# Patient Record
Sex: Male | Born: 1965 | Race: Black or African American | Hispanic: No | Marital: Single | Smoking: Current every day smoker
Health system: Southern US, Community
[De-identification: ages and names within clinical notes are randomized; demographics above are authoritative.]

## PROBLEM LIST (undated history)

## (undated) DIAGNOSIS — K5792 Diverticulitis of intestine, part unspecified, without perforation or abscess without bleeding: Secondary | ICD-10-CM

## (undated) HISTORY — PX: NO PAST SURGERIES: SHX2092

---

## 1999-04-01 ENCOUNTER — Encounter: Payer: Self-pay | Admitting: Emergency Medicine

## 1999-04-01 ENCOUNTER — Emergency Department (HOSPITAL_COMMUNITY): Admission: EM | Admit: 1999-04-01 | Discharge: 1999-04-01 | Payer: Self-pay | Admitting: Emergency Medicine

## 2012-07-30 ENCOUNTER — Emergency Department (HOSPITAL_COMMUNITY)
Admission: EM | Admit: 2012-07-30 | Discharge: 2012-07-30 | Disposition: A | Discharge status: Home / Self Care | Payer: Self-pay | Attending: Emergency Medicine | Admitting: Emergency Medicine

## 2012-07-30 ENCOUNTER — Emergency Department (HOSPITAL_COMMUNITY): Payer: Self-pay

## 2012-07-30 ENCOUNTER — Encounter (HOSPITAL_COMMUNITY): Payer: Self-pay | Admitting: *Deleted

## 2012-07-30 DIAGNOSIS — R1110 Vomiting, unspecified: Secondary | ICD-10-CM | POA: Insufficient documentation

## 2012-07-30 DIAGNOSIS — F17200 Nicotine dependence, unspecified, uncomplicated: Secondary | ICD-10-CM | POA: Insufficient documentation

## 2012-07-30 DIAGNOSIS — K5732 Diverticulitis of large intestine without perforation or abscess without bleeding: Secondary | ICD-10-CM | POA: Insufficient documentation

## 2012-07-30 DIAGNOSIS — R1032 Left lower quadrant pain: Secondary | ICD-10-CM | POA: Insufficient documentation

## 2012-07-30 DIAGNOSIS — K5792 Diverticulitis of intestine, part unspecified, without perforation or abscess without bleeding: Secondary | ICD-10-CM

## 2012-07-30 LAB — CBC WITH DIFFERENTIAL/PLATELET
Basophils Absolute: 0 10*3/uL (ref 0.0–0.1)
Basophils Relative: 0 % (ref 0–1)
Eosinophils Absolute: 0 10*3/uL (ref 0.0–0.7)
Eosinophils Relative: 0 % (ref 0–5)
HCT: 45.2 % (ref 39.0–52.0)
Hemoglobin: 15.2 g/dL (ref 13.0–17.0)
Lymphocytes Relative: 11 % — ABNORMAL LOW (ref 12–46)
Lymphs Abs: 2.1 10*3/uL (ref 0.7–4.0)
MCH: 29 pg (ref 26.0–34.0)
MCHC: 33.6 g/dL (ref 30.0–36.0)
MCV: 86.3 fL (ref 78.0–100.0)
Monocytes Absolute: 1.5 10*3/uL — ABNORMAL HIGH (ref 0.1–1.0)
Monocytes Relative: 8 % (ref 3–12)
Neutro Abs: 15.2 10*3/uL — ABNORMAL HIGH (ref 1.7–7.7)
Neutrophils Relative %: 81 % — ABNORMAL HIGH (ref 43–77)
Platelets: ADEQUATE 10*3/uL (ref 150–400)
RBC: 5.24 MIL/uL (ref 4.22–5.81)
RDW: 13.1 % (ref 11.5–15.5)
WBC: 18.8 10*3/uL — ABNORMAL HIGH (ref 4.0–10.5)

## 2012-07-30 LAB — BASIC METABOLIC PANEL
BUN: 10 mg/dL (ref 6–23)
CO2: 27 mEq/L (ref 19–32)
Calcium: 10.2 mg/dL (ref 8.4–10.5)
Chloride: 97 mEq/L (ref 96–112)
Creatinine, Ser: 0.88 mg/dL (ref 0.50–1.35)
GFR calc Af Amer: >90 mL/min (ref >90)
GFR calc non Af Amer: >90 mL/min (ref >90)
Glucose, Bld: 102 mg/dL — ABNORMAL HIGH (ref 70–99)
Potassium: 3.8 mEq/L (ref 3.5–5.1)
Sodium: 136 mEq/L (ref 135–145)

## 2012-07-30 LAB — HEPATIC FUNCTION PANEL
ALT: 20 U/L (ref 0–53)
Alkaline Phosphatase: 92 U/L (ref 39–117)
Bilirubin, Direct: <0.1 mg/dL (ref 0.0–0.3)
Total Bilirubin: 0.5 mg/dL (ref 0.3–1.2)
Total Protein: 8.4 g/dL — ABNORMAL HIGH (ref 6.0–8.3)

## 2012-07-30 LAB — OCCULT BLOOD, POC DEVICE: Fecal Occult Bld: NEGATIVE

## 2012-07-30 LAB — LIPASE, BLOOD: Lipase: 15 U/L (ref 11–59)

## 2012-07-30 MED ORDER — OXYCODONE-ACETAMINOPHEN 5-325 MG PO TABS: 2.0000 | ORAL_TABLET | ORAL | Status: DC | PRN

## 2012-07-30 MED ORDER — METRONIDAZOLE 500 MG PO TABS: 500.0000 mg | ORAL_TABLET | Freq: Two times a day (BID) | ORAL | Status: DC

## 2012-07-30 MED ORDER — IOHEXOL 300 MG/ML  SOLN
20.0000 mL | INTRAMUSCULAR | Status: AC
  Administered 2012-07-30 (×2): 20 mL via ORAL

## 2012-07-30 MED ORDER — CIPROFLOXACIN HCL 500 MG PO TABS: 500.0000 mg | ORAL_TABLET | Freq: Two times a day (BID) | ORAL | Status: DC

## 2012-07-30 MED ORDER — HYDROMORPHONE HCL PF 1 MG/ML IJ SOLN
1.0000 mg | Freq: Once | INTRAMUSCULAR | Status: AC
  Administered 2012-07-30: 1 mg via INTRAVENOUS
  Filled 2012-07-30: qty 1

## 2012-07-30 MED ORDER — METRONIDAZOLE IN NACL 5-0.79 MG/ML-% IV SOLN
500.0000 mg | Freq: Once | INTRAVENOUS | Status: AC
  Administered 2012-07-30: 500 mg via INTRAVENOUS
  Filled 2012-07-30: qty 100

## 2012-07-30 MED ORDER — CIPROFLOXACIN IN D5W 400 MG/200ML IV SOLN
400.0000 mg | Freq: Once | INTRAVENOUS | Status: AC
  Administered 2012-07-30: 400 mg via INTRAVENOUS
  Filled 2012-07-30: qty 200

## 2012-07-30 MED ORDER — MORPHINE SULFATE 4 MG/ML IJ SOLN
4.0000 mg | Freq: Once | INTRAMUSCULAR | Status: AC
  Administered 2012-07-30: 4 mg via INTRAVENOUS
  Filled 2012-07-30: qty 1

## 2012-07-30 MED ORDER — IOHEXOL 300 MG/ML  SOLN
80.0000 mL | Freq: Once | INTRAMUSCULAR | Status: AC | PRN
  Administered 2012-07-30: 80 mL via INTRAVENOUS

## 2012-07-30 MED ORDER — ONDANSETRON HCL 4 MG/2ML IJ SOLN
4.0000 mg | Freq: Once | INTRAMUSCULAR | Status: AC
  Administered 2012-07-30: 4 mg via INTRAVENOUS
  Filled 2012-07-30: qty 2

## 2012-07-30 NOTE — ED Notes (Signed)
Pt states he started having LLQ abdominal pain 3-4 day ago, states that it gradually got worse. Pt states "I felt aknot so I thought I had to go to the bathroom, so I started taking laxatives, but it still hurts" Pt LLQ of abdomen tender to palpation. No distension. Pt denies nausea, but had 1 episode of emesis in waiting room. Pt reports 9/10 pain upon moving and at rest. Pt states he put heat against affected area, and states that it seemed to make it worse.

## 2012-07-30 NOTE — ED Provider Notes (Signed)
8:44 PM Patient with a hx sig for abdominal pain was placed in CDU for observation and to receive IV antibiotics by Dr. Freida Busman. Patient care resumed from Dr. Freida Busman .  Patient is here for diverticulitis and has received IV Flagyl, Cipro, morphine and zofran. Patient re-evaluated and is resting comfortable, VSS, with no new complaints or concerns at this time. Plan per previous provider is to discharge with PO Cipro and Flagyl and percocet. On exam: hemodynamically stable, NAD, heart w/ RRR, lungs CTAB, Chest non-tender, no peripheral edema or calf tenderness. Abdominal tenderness to palpation most notably of lower left quadrant but patient expresses subjective improvement of pain since arrival.   Patient will be discharged and instructions to return with worsening or concerning symptoms.      Emilia Beck, PA-C 07/30/12 2049

## 2012-07-30 NOTE — ED Provider Notes (Signed)
History     CSN: [id number]  Arrival date & time 07/30/12  1001   First MD Initiated Contact with Patient 07/30/12 1124      Chief Complaint  Patient presents with  . Abdominal Pain    (Consider location/radiation/quality/duration/timing/severity/associated sxs/prior treatment) The history is provided by the patient and medical records.    Grant Mitchell is a 46 y.o. male presents for left lower quadrant abdominal pain. Patient states it began gradually approximately 4 days ago, has been persistent and gradually worsening. Patient states he thought that he was constipated and has been "popping laxatives." This caused him to have a number of bowel movements but that did not ease his pain. Patient states he was unable to sleep last night due to the pain in his abdomen.  Patient states the pain is localized to the left lower quadrant, described as sharp, rated an 8 at 10, nonradiating.  Denies fever, chills, headache, neck pain, chest pain, shortness of breath, heartburn, nausea, diarrhea, loss of consciousness, syncope, weakness.  Patient states he thought that he might have blood in his stool however he's been taking a great medication for his cold and he believes that the red in his stool is from the dye. He states to read in his stool was bright red, like "fake red."  No history of abdominal surgery, diverticulitis or diverticulosis.  Patient also states he had one episode of vomiting this morning. He states it was nonbloody, nonbilious.  History reviewed. No pertinent past medical history.  History reviewed. No pertinent past surgical history.  No family history on file.  History  Substance Use Topics  . Smoking status: Current Every Day Smoker  . Smokeless tobacco: Not on file  . Alcohol Use: No      Review of Systems  Constitutional: Negative for fever, diaphoresis, appetite change, fatigue and unexpected weight change.  HENT: Negative for mouth sores, trouble swallowing,  neck pain and neck stiffness.   Respiratory: Negative for cough, chest tightness, shortness of breath, wheezing and stridor.   Cardiovascular: Negative for chest pain and palpitations.  Gastrointestinal: Positive for vomiting (x1) and abdominal pain. Negative for nausea, diarrhea, constipation, blood in stool, abdominal distention and rectal pain.  Genitourinary: Negative for dysuria, urgency, frequency, hematuria, flank pain and difficulty urinating.  Musculoskeletal: Negative for back pain.  Skin: Negative for rash.  Neurological: Negative for weakness.  Hematological: Negative for adenopathy.  Psychiatric/Behavioral: Negative for confusion.  All other systems reviewed and are negative.    Allergies  Ibuprofen and Penicillins  Home Medications   Current Outpatient Rx  Name Route Sig Dispense Refill  . GUAIFENESIN 100 MG/5ML PO LIQD Oral Take 200 mg by mouth 3 (three) times daily as needed. Cold, congestion    . NYQUIL PO Oral Take 15 mLs by mouth every 6 (six) hours as needed. Cold symptoms      BP 127/82  Pulse 87  Temp 97.4 F (36.3 C) (Rectal)  Resp 20  SpO2 100%  Physical Exam  Nursing note and vitals reviewed. Constitutional: He appears well-developed and well-nourished. No distress.  HENT:  Head: Normocephalic and atraumatic.  Mouth/Throat: Oropharynx is clear and moist. No oropharyngeal exudate.  Eyes: Conjunctivae normal are normal. Pupils are equal, round, and reactive to light. No scleral icterus.  Neck: Normal range of motion. Neck supple.  Cardiovascular: Normal rate, regular rhythm and intact distal pulses.   Pulmonary/Chest: Effort normal and breath sounds normal. No respiratory distress. He has no wheezes.  Abdominal: Soft. Normal appearance and bowel sounds are normal. He exhibits no mass. There is no hepatosplenomegaly. There is tenderness in the left lower quadrant. There is guarding (with palpation of LLQ). There is no rigidity, no rebound, no CVA  tenderness, no tenderness at McBurney's point and negative Murphy's sign.       Positive peritoneal signs in the LLQ  Musculoskeletal: Normal range of motion. He exhibits no edema.  Neurological: He is alert. He exhibits normal muscle tone. Coordination normal.       Speech is clear and goal oriented Moves extremities without ataxia  Skin: Skin is warm and dry. No rash noted. He is not diaphoretic.  Psychiatric: He has a normal mood and affect.    ED Course  Procedures (including critical care time)  Labs Reviewed  CBC WITH DIFFERENTIAL - Abnormal; Notable for the following:    WBC 18.8 (*)  WHITE COUNT CONFIRMED ON SMEAR   Neutrophils Relative 81 (*)     Lymphocytes Relative 11 (*)     Neutro Abs 15.2 (*)     Monocytes Absolute 1.5 (*)     All other components within normal limits  BASIC METABOLIC PANEL - Abnormal; Notable for the following:    Glucose, Bld 102 (*)     All other components within normal limits  HEPATIC FUNCTION PANEL - Abnormal; Notable for the following:    Total Protein 8.4 (*)     All other components within normal limits  LIPASE, BLOOD  OCCULT BLOOD, POC DEVICE  URINALYSIS, ROUTINE W REFLEX MICROSCOPIC   Ct Abdomen Pelvis W Contrast  07/30/2012  *RADIOLOGY REPORT*  Clinical Data: Left lower quadrant abdominal pain  CT ABDOMEN AND PELVIS WITH CONTRAST  Technique:  Multidetector CT imaging of the abdomen and pelvis was performed following the standard protocol during bolus administration of intravenous contrast.  Contrast: 80mL OMNIPAQUE IOHEXOL 300 MG/ML  SOLN  Comparison: None.  Findings: Minimal dependent atelectasis at the lung bases.  Scattered probable hepatic cysts measuring up to 9 mm.  Spleen, pancreas, and adrenal glands are within normal limits.  Gallbladder is unremarkable.  No intrahepatic or extrahepatic ductal dilatation.  Kidneys are within normal limits.  No hydronephrosis.  No evidence of bowel obstruction.  Pericolonic inflammatory changes  involving the proximal sigmoid colon (series 2/image 49), suspicious for sigmoid diverticulitis. Associated pericolonic wall thickening (series 2/image 47).  No drainable fluid collection or abscess.  No free air.  No evidence of abdominal aortic aneurysm.  No abdominopelvic ascites.  No suspicious abdominopelvic lymphadenopathy.  Prostate is unremarkable.  Bladder is within normal limits.  Fluid within the right inguinal canal.  Visualized osseous structures are within normal limits.  IMPRESSION: Suspected sigmoid diverticulitis.  No drainable fluid collection or abscess.  No free air.  Screening colonoscopy is suggested following resolution of acute symptoms.   Original Report Authenticated By: Charline Bills, M.D.     Results for orders placed during the hospital encounter of 07/30/12  CBC WITH DIFFERENTIAL      Component Value Range   WBC 18.8 (*) 4.0 - 10.5 K/uL   RBC 5.24  4.22 - 5.81 MIL/uL   Hemoglobin 15.2  13.0 - 17.0 g/dL   HCT 16.1  09.6 - 04.5 %   MCV 86.3  78.0 - 100.0 fL   MCH 29.0  26.0 - 34.0 pg   MCHC 33.6  30.0 - 36.0 g/dL   RDW 40.9  81.1 - 91.4 %   Platelets  150 - 400 K/uL   Value: PLATELET CLUMPS NOTED ON SMEAR, COUNT APPEARS ADEQUATE   Neutrophils Relative 81 (*) 43 - 77 %   Lymphocytes Relative 11 (*) 12 - 46 %   Monocytes Relative 8  3 - 12 %   Eosinophils Relative 0  0 - 5 %   Basophils Relative 0  0 - 1 %   Neutro Abs 15.2 (*) 1.7 - 7.7 K/uL   Lymphs Abs 2.1  0.7 - 4.0 K/uL   Monocytes Absolute 1.5 (*) 0.1 - 1.0 K/uL   Eosinophils Absolute 0.0  0.0 - 0.7 K/uL   Basophils Absolute 0.0  0.0 - 0.1 K/uL   Smear Review MORPHOLOGY UNREMARKABLE    BASIC METABOLIC PANEL      Component Value Range   Sodium 136  135 - 145 mEq/L   Potassium 3.8  3.5 - 5.1 mEq/L   Chloride 97  96 - 112 mEq/L   CO2 27  19 - 32 mEq/L   Glucose, Bld 102 (*) 70 - 99 mg/dL   BUN 10  6 - 23 mg/dL   Creatinine, Ser 1.61  0.50 - 1.35 mg/dL   Calcium 09.6  8.4 - 04.5 mg/dL   GFR calc  non Af Amer >90  >90 mL/min   GFR calc Af Amer >90  >90 mL/min  LIPASE, BLOOD      Component Value Range   Lipase 15  11 - 59 U/L  HEPATIC FUNCTION PANEL      Component Value Range   Total Protein 8.4 (*) 6.0 - 8.3 g/dL   Albumin 4.3  3.5 - 5.2 g/dL   AST 15  0 - 37 U/L   ALT 20  0 - 53 U/L   Alkaline Phosphatase 92  39 - 117 U/L   Total Bilirubin 0.5  0.3 - 1.2 mg/dL   Bilirubin, Direct <4.0  0.0 - 0.3 mg/dL   Indirect Bilirubin NOT CALCULATED  0.3 - 0.9 mg/dL  OCCULT BLOOD, POC DEVICE      Component Value Range   Fecal Occult Bld NEGATIVE     Ct Abdomen Pelvis W Contrast  07/30/2012  *RADIOLOGY REPORT*  Clinical Data: Left lower quadrant abdominal pain  CT ABDOMEN AND PELVIS WITH CONTRAST  Technique:  Multidetector CT imaging of the abdomen and pelvis was performed following the standard protocol during bolus administration of intravenous contrast.  Contrast: 80mL OMNIPAQUE IOHEXOL 300 MG/ML  SOLN  Comparison: None.  Findings: Minimal dependent atelectasis at the lung bases.  Scattered probable hepatic cysts measuring up to 9 mm.  Spleen, pancreas, and adrenal glands are within normal limits.  Gallbladder is unremarkable.  No intrahepatic or extrahepatic ductal dilatation.  Kidneys are within normal limits.  No hydronephrosis.  No evidence of bowel obstruction.  Pericolonic inflammatory changes involving the proximal sigmoid colon (series 2/image 49), suspicious for sigmoid diverticulitis. Associated pericolonic wall thickening (series 2/image 47).  No drainable fluid collection or abscess.  No free air.  No evidence of abdominal aortic aneurysm.  No abdominopelvic ascites.  No suspicious abdominopelvic lymphadenopathy.  Prostate is unremarkable.  Bladder is within normal limits.  Fluid within the right inguinal canal.  Visualized osseous structures are within normal limits.  IMPRESSION: Suspected sigmoid diverticulitis.  No drainable fluid collection or abscess.  No free air.  Screening  colonoscopy is suggested following resolution of acute symptoms.   Original Report Authenticated By: Charline Bills, M.D.       1. Diverticulitis  MDM  Lawana Chambers Siebers chest abdominal pain.  His pain and left lower quadrant with peritoneal signs.  Concern for diverticulitis versus perforated bowel.  CBC with elevated white count at 18.8 and left shift. Lipase within normal limits, BMP unremarkable, occult blood negative.  CT with diverticulitis.  Pt pain medication redosed, IV cipro and flagyl started.  After IV abx, if patient's pain is under control he may go home with PO abx.  I have discussed the patient with Mckinley Jewel. PA-C in the CDU and she will continue care.         Dahlia Client Charlese Gruetzmacher, PA-C 07/30/12 1612

## 2012-07-30 NOTE — ED Provider Notes (Signed)
Medical screening examination/treatment/procedure(s) were conducted as a shared visit with non-physician practitioner(s) and myself.  I personally evaluated the patient during the encounter  Pt to have iv abx and likely d/c to home--no surgical present  Toy Baker, MD 07/30/12 [phone]

## 2012-07-30 NOTE — ED Notes (Signed)
Pt is here with LLQ abdominal pain for 4 days and thought he need to poop and took laxative.  Pt is having bowel movement but no relief of pain.  Pt thought he may have seen some blood but not sure.  No urinary symptoms

## 2012-07-31 NOTE — ED Provider Notes (Signed)
Medical screening examination/treatment/procedure(s) were conducted as a shared visit with non-physician practitioner(s) and myself.  I personally evaluated the patient during the encounter  Toy Baker, MD 07/31/12 [phone]

## 2012-07-31 NOTE — ED Provider Notes (Signed)
Medical screening examination/treatment/procedure(s) were conducted as a shared visit with non-physician practitioner(s) and myself.  I personally evaluated the patient during the encounter  Caydn Justen T Jrake Rodriquez, MD 07/31/12 0804 

## 2017-12-12 ENCOUNTER — Emergency Department (HOSPITAL_COMMUNITY)
Admission: EM | Admit: 2017-12-12 | Discharge: 2017-12-12 | Disposition: A | Discharge status: Home / Self Care | Payer: Self-pay | Attending: Emergency Medicine | Admitting: Emergency Medicine

## 2017-12-12 ENCOUNTER — Emergency Department (HOSPITAL_COMMUNITY): Payer: Self-pay

## 2017-12-12 ENCOUNTER — Encounter (HOSPITAL_COMMUNITY): Payer: Self-pay

## 2017-12-12 DIAGNOSIS — M545 Low back pain, unspecified: Secondary | ICD-10-CM

## 2017-12-12 DIAGNOSIS — F17210 Nicotine dependence, cigarettes, uncomplicated: Secondary | ICD-10-CM | POA: Insufficient documentation

## 2017-12-12 MED ORDER — METHOCARBAMOL 750 MG PO TABS: 750.0000 mg | ORAL_TABLET | Freq: Three times a day (TID) | ORAL | 0 refills | Status: DC | PRN

## 2017-12-12 MED ORDER — KETOROLAC TROMETHAMINE 30 MG/ML IJ SOLN
30.0000 mg | Freq: Once | INTRAMUSCULAR | Status: AC
  Administered 2017-12-12: 30 mg via INTRAMUSCULAR
  Filled 2017-12-12: qty 1

## 2017-12-12 MED ORDER — DIAZEPAM 5 MG PO TABS
5.0000 mg | ORAL_TABLET | Freq: Once | ORAL | Status: AC
Start: 2017-12-12 — End: 2017-12-12
  Administered 2017-12-12: 5 mg via ORAL
  Filled 2017-12-12: qty 1

## 2017-12-12 NOTE — ED Notes (Signed)
ED Provider at bedside. 

## 2017-12-12 NOTE — ED Triage Notes (Signed)
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 Pt was ambulatory in ED. Denies any numbness or tingling in arms or legs. No change in bowel or bladder.

## 2017-12-12 NOTE — ED Provider Notes (Signed)
[facility] Provider [facility]   CSN: [id number] Arrival date & time: 12/12/17  0732     History   Chief Complaint Chief Complaint  Patient presents with  . Back Pain    HPI Reginold AgentDonald B Tatro is a 52 y.o. male reports that he was at work rolling a large heavy mold When he used a twisting motion with his back to push it the rest of the way.  He reports sudden, immediate onset of bilateral back pain.  He denies any changes to bowel or bladder function, no leg numbness or tingling.  He is able to walk without difficulty.  Tried anything for his pain, it is made worse with movement and sitting.   HPI  History reviewed. No pertinent past medical history.  There are no active problems to display for this patient.   History reviewed. No pertinent surgical history.     Home Medications    Prior to Admission medications   Medication Sig Start Date End Date Taking? Authorizing Provider  ciprofloxacin (CIPRO) 500 MG tablet Take 1 tablet (500 mg total) by mouth every 12 (twelve) hours. 07/30/12   Emilia BeckSzekalski, Kaitlyn, PA-C  guaiFENesin (ROBITUSSIN) 100 MG/5ML liquid Take 200 mg by mouth 3 (three) times daily as needed. Cold, congestion    [provider]  methocarbamol (ROBAXIN) 750 MG tablet Take 1-2 tablets (750-1,500 mg total) by mouth 3 (three) times daily as needed for muscle spasms. 12/12/17   Cristina GongHammond, Marizol Borror W, PA-C  metroNIDAZOLE (FLAGYL) 500 MG tablet Take 1 tablet (500 mg total) by mouth 2 (two) times daily. 07/30/12   Emilia BeckSzekalski, Kaitlyn, PA-C  oxyCODONE-acetaminophen (PERCOCET/ROXICET) 5-325 MG per tablet Take 2 tablets by mouth every 4 (four) hours as needed for pain. 07/30/12   Szekalski, Kaitlyn, PA-C  Pseudoeph-Doxylamine-DM-APAP (NYQUIL PO) Take 15 mLs by mouth every 6 (six) hours as needed. Cold symptoms    [provider]    Family History No family history on file.  Social History Social History   Tobacco Use  .  Smoking status: Current Every Day Smoker    Packs/day: 1.00    Types: Cigarettes  . Smokeless tobacco: Never Used  Substance Use Topics  . Alcohol use: No  . Drug use: Yes    Types: Marijuana     Allergies   Ibuprofen and Penicillins   Review of Systems Review of Systems  Constitutional: Negative for chills and fever.  Gastrointestinal: Negative for abdominal pain.  Genitourinary: Negative for decreased urine volume, difficulty urinating and dysuria.  Musculoskeletal: Positive for back pain. Negative for neck pain and neck stiffness.  Neurological: Negative for headaches.  Psychiatric/Behavioral: Negative for confusion.     Physical Exam Updated Vital Signs BP 115/65 (BP Location: Right Arm)   Pulse 71   Temp 98.1 F (36.7 C) (Oral)   Resp 17   Ht 5\' 7"  (1.702 m)   Wt 68 kg (150 lb)   SpO2 100%   BMI 23.49 kg/m   Physical Exam  Constitutional: He appears well-developed and well-nourished.  Cardiovascular: Intact distal pulses.  2+ DP/PT pulses bilaterally.  Bilateral feet are warm and well perfused.  Abdominal: Soft. He exhibits no distension. There is no tenderness. There is no guarding.  No pulsatile masses palpated.  Musculoskeletal:  There is tender to palpation over bilateral lumbar back.  Muscles here feel tight, and spasm.  There is no midline tenderness.  Palpation over bilateral lumbar paraspinal muscles both re-creates and exacerbates the reported  pain.  Neurological:  Intact sensation to bilateral lower extremities.  Skin: Skin is warm and dry. Capillary refill takes less than 2 seconds. He is not diaphoretic.  Nursing note and vitals reviewed.    ED Treatments / Results  Labs (all labs ordered are listed, but only abnormal results are displayed) Labs Reviewed - No data to display  EKG  EKG Interpretation None       Radiology Dg Lumbar Spine Complete  Result Date: 12/12/2017 CLINICAL DATA:  Low back pain following work injury. EXAM:  LUMBAR SPINE - COMPLETE 4+ VIEW COMPARISON:  Coronal and sagittal reconstructed images through the lumbar spine from an abdominal and pelvic CT scan dated July 30, 2012. FINDINGS: The lumbar vertebral bodies are preserved in height. The disc space heights are well maintained. There is no spondylolisthesis. The pedicles and transverse processes are intact. The observed portions of the sacrum are normal. IMPRESSION: There is no acute or chronic bony abnormality of the lumbar spine. Electronically Signed   By: David  [place] M.D.   On: 12/12/2017 10:19    Procedures Procedures (including critical care time)  Medications Ordered in ED Medications  diazepam (VALIUM) tablet 5 mg (5 mg Oral Given 12/12/17 1023)  ketorolac (TORADOL) 30 MG/ML injection 30 mg (30 mg Intramuscular Given 12/12/17 1023)     Initial Impression / Assessment and Plan / ED Course  I have reviewed the triage vital signs and the nursing notes.  Pertinent labs & imaging results that were available during my care of the patient were reviewed by me and considered in my medical decision making (see chart for details).    Patient with back pain.  No neurological deficits and normal neuro exam.  Patient can walk but states is painful.  No loss of bowel or bladder control.  No concern for cauda equina.  No fever, night sweats, weight loss, h/o cancer, IVDU.  RICE protocol and pain medicine indicated and discussed with patient. We discussed toradol as he has a listed allergy to iburprofen but it is stomach upset.  He wishes to have toradol shot.      Final Clinical Impressions(s) / ED Diagnoses   Final diagnoses:  Acute bilateral low back pain without sciatica    ED Discharge Orders        Ordered    methocarbamol (ROBAXIN) 750 MG tablet  3 times daily PRN     12/12/17 1026       Cristina Gong, [place] 12/12/17 1358    Cathren Laine, MD 12/12/17 1439

## 2017-12-12 NOTE — Discharge Instructions (Signed)
Do not take any ibuprofen or medications other than tylenol for 8 hours after your shot of Toradol.  Please take Ibuprofen (Advil, motrin) and Tylenol (acetaminophen) to relieve your pain.  You may take up to 600 MG (3 pills) of normal strength ibuprofen every 8 hours as needed.  In between doses of ibuprofen you make take tylenol, up to 1,000 mg (two extra strength pills).  Do not take more than 3,000 mg tylenol in a 24 hour period.  Please check all medication labels as many medications such as pain and cold medications may contain tylenol.  Do not drink alcohol while taking these medications.  Do not take other NSAID'S while taking ibuprofen (such as aleve or naproxen).  Please take ibuprofen with food to decrease stomach upset.  The best way to get rid of muscle pain is by taking NSAIDS, using heat, massage therapy, and gentle stretching/range of motion exercises.  Today you received medications that may make you sleepy or impair your ability to make decisions.  For the next 24 hours please do not drive, operate heavy machinery, care for a small child with out another adult present, or perform any activities that may cause harm to you or someone else if you were to fall asleep or be impaired.   You are being prescribed a medication which may make you sleepy. Please follow up of listed precautions for at least 24 hours after taking one dose.  Once you get insurance please establish care with a primary care doctor.

## 2019-03-30 ENCOUNTER — Encounter (HOSPITAL_COMMUNITY): Payer: Self-pay | Admitting: Emergency Medicine

## 2019-03-30 ENCOUNTER — Inpatient Hospital Stay (HOSPITAL_COMMUNITY)
Admission: EM | Admit: 2019-03-30 | Discharge: 2019-04-02 | DRG: 392 | Disposition: A | Discharge status: Home / Self Care | Payer: No Typology Code available for payment source | Attending: Internal Medicine | Admitting: Internal Medicine

## 2019-03-30 ENCOUNTER — Other Ambulatory Visit: Payer: Self-pay

## 2019-03-30 ENCOUNTER — Emergency Department (HOSPITAL_COMMUNITY): Payer: No Typology Code available for payment source

## 2019-03-30 DIAGNOSIS — R1032 Left lower quadrant pain: Secondary | ICD-10-CM | POA: Diagnosis present

## 2019-03-30 DIAGNOSIS — K5720 Diverticulitis of large intestine with perforation and abscess without bleeding: Principal | ICD-10-CM | POA: Diagnosis present

## 2019-03-30 DIAGNOSIS — Z886 Allergy status to analgesic agent status: Secondary | ICD-10-CM

## 2019-03-30 DIAGNOSIS — Z880 Allergy status to penicillin: Secondary | ICD-10-CM | POA: Diagnosis not present

## 2019-03-30 DIAGNOSIS — K5792 Diverticulitis of intestine, part unspecified, without perforation or abscess without bleeding: Secondary | ICD-10-CM

## 2019-03-30 DIAGNOSIS — Z8719 Personal history of other diseases of the digestive system: Secondary | ICD-10-CM | POA: Diagnosis not present

## 2019-03-30 DIAGNOSIS — R3129 Other microscopic hematuria: Secondary | ICD-10-CM | POA: Diagnosis present

## 2019-03-30 DIAGNOSIS — Z87891 Personal history of nicotine dependence: Secondary | ICD-10-CM | POA: Diagnosis not present

## 2019-03-30 DIAGNOSIS — Z20828 Contact with and (suspected) exposure to other viral communicable diseases: Secondary | ICD-10-CM | POA: Diagnosis present

## 2019-03-30 DIAGNOSIS — F17210 Nicotine dependence, cigarettes, uncomplicated: Secondary | ICD-10-CM | POA: Diagnosis present

## 2019-03-30 HISTORY — DX: Diverticulitis of intestine, part unspecified, without perforation or abscess without bleeding: K57.92

## 2019-03-30 LAB — URINALYSIS, ROUTINE W REFLEX MICROSCOPIC
Bacteria, UA: NONE SEEN
Bilirubin Urine: NEGATIVE
Glucose, UA: NEGATIVE mg/dL
Ketones, ur: 20 mg/dL — AB
Leukocytes,Ua: NEGATIVE
Nitrite: NEGATIVE
Protein, ur: NEGATIVE mg/dL
Specific Gravity, Urine: >1.046 — ABNORMAL HIGH (ref 1.005–1.030)
pH: 5 (ref 5.0–8.0)

## 2019-03-30 LAB — CBC WITH DIFFERENTIAL/PLATELET
Abs Immature Granulocytes: 0.1 10*3/uL — ABNORMAL HIGH (ref 0.00–0.07)
Basophils Absolute: 0 10*3/uL (ref 0.0–0.1)
Basophils Relative: 0 %
Eosinophils Absolute: 0 10*3/uL (ref 0.0–0.5)
Eosinophils Relative: 0 %
HCT: 46 % (ref 39.0–52.0)
Hemoglobin: 14.5 g/dL (ref 13.0–17.0)
Immature Granulocytes: 1 %
Lymphocytes Relative: 10 %
Lymphs Abs: 2 10*3/uL (ref 0.7–4.0)
MCH: 28.1 pg (ref 26.0–34.0)
MCHC: 31.5 g/dL (ref 30.0–36.0)
MCV: 89.1 fL (ref 80.0–100.0)
Monocytes Absolute: 1.9 10*3/uL — ABNORMAL HIGH (ref 0.1–1.0)
Monocytes Relative: 9 %
Neutro Abs: 17 10*3/uL — ABNORMAL HIGH (ref 1.7–7.7)
Neutrophils Relative %: 80 %
Platelets: 268 10*3/uL (ref 150–400)
RBC: 5.16 MIL/uL (ref 4.22–5.81)
RDW: 13.2 % (ref 11.5–15.5)
WBC: 21.1 10*3/uL — ABNORMAL HIGH (ref 4.0–10.5)
nRBC: 0 % (ref 0.0–0.2)

## 2019-03-30 LAB — COMPREHENSIVE METABOLIC PANEL
ALT: 18 U/L (ref 0–44)
AST: 11 U/L — ABNORMAL LOW (ref 15–41)
Albumin: 3.7 g/dL (ref 3.5–5.0)
Alkaline Phosphatase: 70 U/L (ref 38–126)
Anion gap: 11 (ref 5–15)
BUN: 8 mg/dL (ref 6–20)
CO2: 25 mmol/L (ref 22–32)
Calcium: 9.4 mg/dL (ref 8.9–10.3)
Chloride: 101 mmol/L (ref 98–111)
Creatinine, Ser: 0.85 mg/dL (ref 0.61–1.24)
GFR calc Af Amer: >60 mL/min (ref >60)
GFR calc non Af Amer: >60 mL/min (ref >60)
Glucose, Bld: 110 mg/dL — ABNORMAL HIGH (ref 70–99)
Potassium: 4 mmol/L (ref 3.5–5.1)
Sodium: 137 mmol/L (ref 135–145)
Total Bilirubin: 0.9 mg/dL (ref 0.3–1.2)
Total Protein: 7.4 g/dL (ref 6.5–8.1)

## 2019-03-30 LAB — LACTIC ACID, PLASMA: Lactic Acid, Venous: 1 mmol/L (ref 0.5–1.9)

## 2019-03-30 LAB — SARS CORONAVIRUS 2 BY RT PCR (HOSPITAL ORDER, PERFORMED IN ~~LOC~~ HOSPITAL LAB): SARS Coronavirus 2: NEGATIVE

## 2019-03-30 MED ORDER — HYDROMORPHONE HCL 1 MG/ML IJ SOLN
1.0000 mg | Freq: Once | INTRAMUSCULAR | Status: AC
  Administered 2019-03-30: 12:00:00 1 mg via INTRAVENOUS
  Filled 2019-03-30: qty 1

## 2019-03-30 MED ORDER — HYDROMORPHONE HCL 1 MG/ML IJ SOLN
1.0000 mg | INTRAMUSCULAR | Status: DC | PRN
  Administered 2019-03-30 – 2019-04-01 (×5): 1 mg via INTRAVENOUS
  Filled 2019-03-30 (×7): qty 1

## 2019-03-30 MED ORDER — METRONIDAZOLE IN NACL 5-0.79 MG/ML-% IV SOLN
500.0000 mg | Freq: Three times a day (TID) | INTRAVENOUS | Status: DC
  Administered 2019-03-30 – 2019-04-02 (×8): 500 mg via INTRAVENOUS
  Filled 2019-03-30 (×8): qty 100

## 2019-03-30 MED ORDER — SODIUM CHLORIDE 0.9 % IV BOLUS
1000.0000 mL | Freq: Once | INTRAVENOUS | Status: AC
  Administered 2019-03-30: 10:00:00 1000 mL via INTRAVENOUS

## 2019-03-30 MED ORDER — ENOXAPARIN SODIUM 40 MG/0.4ML ~~LOC~~ SOLN
40.0000 mg | SUBCUTANEOUS | Status: DC
  Administered 2019-03-30 – 2019-03-31 (×2): 40 mg via SUBCUTANEOUS
  Filled 2019-03-30 (×3): qty 0.4

## 2019-03-30 MED ORDER — ONDANSETRON HCL 4 MG/2ML IJ SOLN
4.0000 mg | Freq: Once | INTRAMUSCULAR | Status: AC
  Administered 2019-03-30: 4 mg via INTRAVENOUS
  Filled 2019-03-30: qty 2

## 2019-03-30 MED ORDER — IOHEXOL 300 MG/ML  SOLN
100.0000 mL | Freq: Once | INTRAMUSCULAR | Status: AC | PRN
  Administered 2019-03-30: 12:00:00 100 mL via INTRAVENOUS

## 2019-03-30 MED ORDER — MORPHINE SULFATE (PF) 4 MG/ML IV SOLN
4.0000 mg | Freq: Once | INTRAVENOUS | Status: AC
Start: 2019-03-30 — End: 2019-03-30
  Administered 2019-03-30: 10:00:00 4 mg via INTRAVENOUS
  Filled 2019-03-30: qty 1

## 2019-03-30 MED ORDER — SODIUM CHLORIDE 0.9 % IV SOLN
2.0000 g | Freq: Three times a day (TID) | INTRAVENOUS | Status: DC
  Administered 2019-03-30 – 2019-03-31 (×2): 2 g via INTRAVENOUS
  Filled 2019-03-30 (×5): qty 2

## 2019-03-30 MED ORDER — METRONIDAZOLE IN NACL 5-0.79 MG/ML-% IV SOLN
500.0000 mg | Freq: Once | INTRAVENOUS | Status: AC
  Administered 2019-03-30: 14:00:00 500 mg via INTRAVENOUS
  Filled 2019-03-30: qty 100

## 2019-03-30 MED ORDER — CEFEPIME HCL 2 G IJ SOLR
2.0000 g | Freq: Once | INTRAMUSCULAR | Status: AC
  Administered 2019-03-30: 2 g via INTRAVENOUS
  Filled 2019-03-30: qty 2

## 2019-03-30 MED ORDER — LACTATED RINGERS IV SOLN
INTRAVENOUS | Status: DC
  Administered 2019-03-30 – 2019-04-01 (×6): via INTRAVENOUS

## 2019-03-30 MED ORDER — ONDANSETRON HCL 4 MG PO TABS: 4.0000 mg | ORAL_TABLET | Freq: Four times a day (QID) | ORAL | Status: DC | PRN

## 2019-03-30 MED ORDER — ACETAMINOPHEN 325 MG PO TABS: 650.0000 mg | ORAL_TABLET | Freq: Four times a day (QID) | ORAL | Status: DC | PRN

## 2019-03-30 MED ORDER — ACETAMINOPHEN 650 MG RE SUPP: 650.0000 mg | Freq: Four times a day (QID) | RECTAL | Status: DC | PRN

## 2019-03-30 MED ORDER — ONDANSETRON HCL 4 MG/2ML IJ SOLN: 4.0000 mg | Freq: Four times a day (QID) | INTRAMUSCULAR | Status: DC | PRN

## 2019-03-30 MED ORDER — SODIUM CHLORIDE 0.9% FLUSH
3.0000 mL | Freq: Two times a day (BID) | INTRAVENOUS | Status: DC
  Administered 2019-03-31: 3 mL via INTRAVENOUS

## 2019-03-30 NOTE — ED Provider Notes (Signed)
[facility] EMERGENCY DEPARTMENT Provider Note   CSN: [id number] Arrival date & time: 03/30/19  3785    History   Chief Complaint Chief Complaint  Patient presents with  . Abdominal Pain    HPI Grant Mitchell is a 53 y.o. male.     The history is provided by the patient.  Abdominal Pain  Pain location:  LLQ Pain quality: aching, heavy, sharp, shooting and stabbing   Pain radiates to:  Does not radiate Pain severity:  Severe Onset quality:  Gradual Duration:  3 days Timing:  Constant Progression:  Worsening Chronicity:  Recurrent Context: diet changes   Context: not alcohol use, not previous surgeries, not recent travel and not trauma   Context comment:  Has not had anything to eat in a few days due to the pain Relieved by:  None tried Worsened by:  Movement Ineffective treatments:  Not moving Associated symptoms: anorexia   Associated symptoms: no constipation, no cough, no diarrhea, no dysuria, no fever, no nausea, no shortness of breath and no vomiting   Risk factors: has not had multiple surgeries and no NSAID use   Risk factors comment:  Hx of diverticulitis 7 years ago that felt similar   History reviewed. No pertinent past medical history.  There are no active problems to display for this patient.   History reviewed. No pertinent surgical history.      Home Medications    Prior to Admission medications   Medication Sig Start Date End Date Taking? Authorizing Provider  ciprofloxacin (CIPRO) 500 MG tablet Take 1 tablet (500 mg total) by mouth every 12 (twelve) hours. 07/30/12   Emilia Beck, PA-C  guaiFENesin (ROBITUSSIN) 100 MG/5ML liquid Take 200 mg by mouth 3 (three) times daily as needed. Cold, congestion    [provider]  methocarbamol (ROBAXIN) 750 MG tablet Take 1-2 tablets (750-1,500 mg total) by mouth 3 (three) times daily as needed for muscle spasms. 12/12/17   Cristina Gong, PA-C  metroNIDAZOLE (FLAGYL)  500 MG tablet Take 1 tablet (500 mg total) by mouth 2 (two) times daily. 07/30/12   Emilia Beck, PA-C  oxyCODONE-acetaminophen (PERCOCET/ROXICET) 5-325 MG per tablet Take 2 tablets by mouth every 4 (four) hours as needed for pain. 07/30/12   Szekalski, Kaitlyn, PA-C  Pseudoeph-Doxylamine-DM-APAP (NYQUIL PO) Take 15 mLs by mouth every 6 (six) hours as needed. Cold symptoms    [provider]    Family History No family history on file.  Social History Social History   Tobacco Use  . Smoking status: Current Every Day Smoker    Packs/day: 1.00    Types: Cigarettes  . Smokeless tobacco: Never Used  Substance Use Topics  . Alcohol use: No  . Drug use: Yes    Types: Marijuana     Allergies   Ibuprofen and Penicillins   Review of Systems Review of Systems  Constitutional: Negative for fever.  Respiratory: Negative for cough and shortness of breath.   Gastrointestinal: Positive for abdominal pain and anorexia. Negative for constipation, diarrhea, nausea and vomiting.  Genitourinary: Negative for dysuria.  All other systems reviewed and are negative.    Physical Exam Updated Vital Signs BP 121/84 (BP Location: Left Arm)   Pulse 93   Temp 98.4 F (36.9 C) (Oral)   Resp 16   Ht 5\' 7"  (1.702 m)   Wt 68.9 kg   SpO2 99%   BMI 23.81 kg/m   Physical Exam Vitals signs and nursing note  reviewed.  Constitutional:      General: He is not in acute distress.    Appearance: He is well-developed.  HENT:     Head: Normocephalic and atraumatic.  Eyes:     Conjunctiva/sclera: Conjunctivae normal.     Pupils: Pupils are equal, round, and reactive to light.  Neck:     Musculoskeletal: Normal range of motion and neck supple.  Cardiovascular:     Rate and Rhythm: Normal rate and regular rhythm.     Heart sounds: No murmur.  Pulmonary:     Effort: Pulmonary effort is normal. No respiratory distress.     Breath sounds: Normal breath sounds. No wheezing or rales.   Abdominal:     General: There is no distension.     Palpations: Abdomen is soft.     Tenderness: There is abdominal tenderness in the left lower quadrant. There is guarding and rebound. There is no right CVA tenderness or left CVA tenderness.     Hernia: No hernia is present. There is no hernia in the ventral area, left inguinal area or left femoral area.  Musculoskeletal: Normal range of motion.        General: No tenderness.  Skin:    General: Skin is warm and dry.     Findings: No erythema or rash.  Neurological:     Mental Status: He is alert and oriented to person, place, and time.  Psychiatric:        Behavior: Behavior normal.      ED Treatments / Results  Labs (all labs ordered are listed, but only abnormal results are displayed) Labs Reviewed  CBC WITH DIFFERENTIAL/PLATELET - Abnormal; Notable for the following components:      Result Value   WBC 21.1 (*)    Neutro Abs 17.0 (*)    Monocytes Absolute 1.9 (*)    Abs Immature Granulocytes 0.10 (*)    All other components within normal limits  COMPREHENSIVE METABOLIC PANEL - Abnormal; Notable for the following components:   Glucose, Bld 110 (*)    AST 11 (*)    All other components within normal limits  URINALYSIS, ROUTINE W REFLEX MICROSCOPIC - Abnormal; Notable for the following components:   Specific Gravity, Urine >1.046 (*)    Hgb urine dipstick MODERATE (*)    Ketones, ur 20 (*)    All other components within normal limits  SARS CORONAVIRUS 2 (HOSPITAL ORDER, PERFORMED IN [facility])  LACTIC ACID, PLASMA    EKG None  Radiology Ct Abdomen Pelvis W Contrast  Result Date: 03/30/2019 CLINICAL DATA:  Sharp left lower quadrant pain since last night. Diarrhea. EXAM: CT ABDOMEN AND PELVIS WITH CONTRAST TECHNIQUE: Multidetector CT imaging of the abdomen and pelvis was performed using the standard protocol following bolus administration of intravenous contrast. CONTRAST:  OMNIPAQUE IOHEXOL 300  MG/ML  SOLN COMPARISON:  07/30/2012 FINDINGS: Lower chest: Lung bases are clear. Hepatobiliary: Scattered hepatic cysts measuring up to 9 mm. Gallbladder is unremarkable. No intrahepatic or extrahepatic ductal dilatation. Pancreas: Within normal limits. Spleen: Within normal limits. Adrenals/Urinary Tract: Adrenal glands are within normal limits. Kidneys are within normal limits.  No hydronephrosis. Bladder is mildly thick-walled although underdistended. Stomach/Bowel: Stomach is within normal limits. No evidence of bowel obstruction. Mobile cecum in the left mid abdomen. Normal appendix (series 3/image 47). Left colonic diverticulosis with associated wall thickening/inflammatory changes in the left lower quadrant, reflecting acute diverticulitis (series 3/image 50). Mild localized perforation with pericolonic gas (coronal images  38 and 40). Associated mild mesenteric stranding. No drainable fluid collection/abscess. No free air. Vascular/Lymphatic: No evidence of abdominal aortic aneurysm. No suspicious abdominopelvic lymphadenopathy. Reproductive: Prostate is unremarkable. Other: No abdominopelvic ascites. Musculoskeletal: Visualized osseous structures are within normal limits. IMPRESSION: Acute left colonic diverticulitis. Mild localized perforation with pericolonic gas and mesenteric stranding. No drainable fluid collection/abscess. No free air. Electronically Signed   By: Charline BillsSriyesh  Krishnan M.D.   On: 03/30/2019 12:02    Procedures Procedures (including critical care time)  Medications Ordered in ED Medications  ceFEPIme (MAXIPIME) 2 g in sodium chloride 0.9 % 100 mL IVPB (2 g Intravenous New Bag/Given 03/30/19 1217)    And  metroNIDAZOLE (FLAGYL) IVPB 500 mg (has no administration in time range)  sodium chloride 0.9 % bolus 1,000 mL (0 mLs Intravenous Stopped 03/30/19 1218)  morphine 4 MG/ML injection 4 mg (4 mg Intravenous Given 03/30/19 1028)  ondansetron (ZOFRAN) injection 4 mg (4 mg Intravenous  Given 03/30/19 1028)  iohexol (OMNIPAQUE) 300 MG/ML solution 100 mL (100 mLs Intravenous Contrast Given 03/30/19 1130)  HYDROmorphone (DILAUDID) injection 1 mg (1 mg Intravenous Given 03/30/19 1228)     Initial Impression / Assessment and Plan / ED Course  I have reviewed the triage vital signs and the nursing notes.  Pertinent labs & imaging results that were available during my care of the patient were reviewed by me and considered in my medical decision making (see chart for details).        Patient is a healthy 53 year old male presenting with 3 days of worsening left lower quadrant pain.  Patient has guarding on exam but no evidence of hernias.  Concern for possible diverticulitis but also could be perforation or renal stone.  Patient has no vomiting and bowel movements have been normal.  Low suspicion for obstruction.  He denies any urinary symptoms and low suspicion for UTI.  Labs, CT and pain medicine given.  1:36 PM Patient has a leukocytosis of 21,000 and normal CMP.  CT shows an acute left colonic diverticulitis with mild localized perforation with gas and mesenteric stranding.  No fluid collection or drainable abscess at this time and no free air.  Patient was started with cefixime and metronidazole due to a penicillin allergy and higher risk diverticulitis.  Will admit to the hospital and continue pain control.  Final Clinical Impressions(s) / ED Diagnoses   Final diagnoses:  Diverticulitis    ED Discharge Orders    None       Gwyneth SproutPlunkett, Renad Jenniges, MD 03/30/19 1338

## 2019-03-30 NOTE — H&P (Addendum)
Date: 03/30/2019               Patient Name:  Grant Mitchell MRN: [id number]  DOB: 19-Feb-1966 Age / Sex: 53 y.o., male   PCP: Patient, No Pcp Per         Medical Service: Internal Medicine Teaching Service         Attending Physician: Dr. Earl Lagos, MD    First Contact: Dr. Gwyneth Revels Pager: [phone]  Second Contact: Dr. Delma Officer Pager: [phone]       After Hours (After 5p/  First Contact Pager: [phone]  weekends / holidays): Second Contact Pager: [phone]   Chief Complaint: Left lower quadrant abdominal pain.  History of Present Illness:  Mr. Fochs is a 53 y.o m with no significant past medical history presented with 3 day history of left lower quadrant pain. CT abdomen shows diverticulitis with microperforation perforation, no obvious abscess formation. Patient was experiencing mild nausea with no vomiting and decreased appetite since Friday.  Denies any fever or chills.  Denies any diarrhea or constipation.  No recent illnesses, sick contact or travel. Has similar symptoms in 2013 and was told that he has infection in his colon. Does not see a physician on a regular basis, no prior colonoscopy.  ED. patient was hemodynamically stable, afebrile.  Labs shows neutrophilic predominant leukocytosis.  CT abdomen was positive for left colonic diverticulitis with mild localized perforation.  No obvious fluid collection or abscess formation.  He received cefepime and metronidazole in ED and was admitted for further management.  Meds:  Current Meds  Medication Sig  . aspirin 325 MG tablet Take 325 mg by mouth once.     Allergies: Allergies as of 03/30/2019 - Review Complete 03/30/2019  Allergen Reaction Noted  . Ibuprofen  07/30/2012  . Penicillins Other (See Comments) 07/30/2012   History reviewed. No pertinent past medical history.  Family History: Both parents had hypertension and cardiac issues, patient is not sure at what age to started getting their heart problem.   Social History: Lives alone, had 2 grownup kids, smokes half a pack per day since age 53, occasionally drinks alcohol, uses marijuana regularly.  Review of Systems: A complete ROS was negative except as per HPI.   Physical Exam: Blood pressure 130/80, pulse 90, temperature 98.4 F (36.9 C), temperature source Oral, resp. rate 14, height 5\' 7"  (1.702 m), weight 68.9 kg, SpO2 98 %. Vitals:   03/30/19 0932 03/30/19 1100 03/30/19 1213 03/30/19 1230  BP: 121/84 129/67 120/87 130/80  Pulse: 93 84 94 90  Resp: 16 16 15 14   Temp: 98.4 F (36.9 C)     TempSrc: Oral     SpO2: 99% 98% 98% 98%  Weight: 68.9 kg     Height: 5\' 7"  (1.702 m)      General: Vital signs reviewed.  Patient is well-developed and well-nourished, in no acute distress and cooperative with exam.  Head: Normocephalic and atraumatic. Eyes: EOMI, conjunctivae normal, no scleral icterus.  Neck: Supple, trachea midline, normal ROM, no JVD,  Cardiovascular: RRR, S1 normal, S2 normal, no murmurs, gallops, or rubs. Pulmonary/Chest: Clear to auscultation bilaterally, no wheezes, rales, or rhonchi. Abdominal: Soft, nondistended, tenderness along left lower quadrant with some guarding, bowel sounds positive. Extremities: No lower extremity edema bilaterally,  pulses symmetric and intact bilaterally. No cyanosis or clubbing. Neurological: A&O x3, Strength is normal and symmetric bilaterally, cranial nerve II-XII are grossly intact, no focal motor deficit, sensory intact to light touch  bilaterally.  Skin: Warm, dry and intact. No rashes or erythema. Psychiatric: Normal mood and affect. speech and behavior is normal. Cognition and memory are normal.  Assessment & Plan by Problem: Active Problems:   Diverticulitis of colon with perforation Hemodynamically stable, mildly nauseated, no vomiting and afebrile. Medical management for now. -N.p.o. -Cefepime and metronidazole. -LR -Can try clear liquids tomorrow if remains stable. -He  will need surgical consult if deteriorates.  Microscopic hematuria.  Patient has painless microscopic hematuria on UA.  No urinary symptoms. -He will need an outpatient urologic evaluation as patient is a smoker.  CODE STATUS.  Full DVT prophylaxis.  Lovenox Diet.  N.p.o.  Dispo: Admit patient to Inpatient with expected length of stay greater than 2 midnights.  SignedArnetha Courser: Kile Kabler, MD 03/30/2019, 1:59 PM  Pager: [phone]

## 2019-03-30 NOTE — Plan of Care (Signed)
  Problem: Clinical Measurements: Goal: Ability to maintain clinical measurements within normal limits will improve Outcome: Progressing   Problem: Activity: Goal: Risk for activity intolerance will decrease Outcome: Progressing   Problem: Coping: Goal: Level of anxiety will decrease Outcome: Progressing   Problem: Elimination: Goal: Will not experience complications related to bowel motility Outcome: Progressing   Problem: Pain Managment: Goal: General experience of comfort will improve Outcome: Progressing   Problem: Safety: Goal: Ability to remain free from injury will improve Outcome: Progressing

## 2019-03-30 NOTE — ED Notes (Signed)
ED TO INPATIENT HANDOFF REPORT  ED Nurse Name and Phone #: Ja Pistole [phone]  S Name/Age/Gender Grant Mitchell 53 y.o. male Room/Bed: 013C/013C  Code Status   Code Status: Full Code  Home/SNF/Other Home Patient oriented to: self, place, time and situation Is this baseline? Yes   Triage Complete: Triage complete  Chief Complaint abdominal pain  Triage Note Pt in with sharp LLQ pain since last night. Pt denies n/v, but had diarrhea x 1 last night. Denies any fevers, sob or cp  Patient reports LLQ pain onset Friday while at work, has progressed since then and is now 10/10, constant, stabbing pain that now feels like it's radiating across lower abdomen. He denies N/V, fevers/chills, urinary symptoms. LLQ very tender to palpation.    Allergies Allergies  Allergen Reactions  . Ibuprofen     800mg  high dose upset stomach and stomach pain  . Penicillins Other (See Comments)    Doesn't remember, was when younger and had to go to the hospital    Level of Care/Admitting Diagnosis ED Disposition    ED Disposition Condition Comment   Admit  Hospital Area[facility] [[id number]]  Level of Care: Med-Surg [16]  Covid Evaluation: Screening Protocol (No Symptoms)  Diagnosis: Diverticulitis of colon with perforation [027253]  Admitting Physician: Earl Lagos [[id number]]  Attending Physician: Earl Lagos [phone]  Estimated length of stay: past midnight tomorrow  Certification:: I certify this patient will need inpatient services for at least 2 midnights  PT Class (Do Not Modify): Inpatient [101]  PT Acc Code (Do Not Modify): Private [1]       B Medical/Surgery History History reviewed. No pertinent past medical history. History reviewed. No pertinent surgical history.   A IV Location/Drains/Wounds Patient Lines/Drains/Airways Status   Active Line/Drains/Airways    Name:   Placement date:   Placement time:   Site:   Days:   Peripheral IV 03/30/19  Right Antecubital   03/30/19    0950    Antecubital   less than 1          Intake/Output Last 24 hours  Intake/Output Summary (Last 24 hours) at 03/30/2019 1405 Last data filed at 03/30/2019 1346 Gross per 24 hour  Intake 1100 ml  Output 200 ml  Net 900 ml    Labs/Imaging Results for orders placed or performed during the hospital encounter of 03/30/19 (from the past 48 hour(s))  CBC with Differential/Platelet     Status: Abnormal   Collection Time: 03/30/19 10:11 AM  Result Value Ref Range   WBC 21.1 (H) 4.0 - 10.5 K/uL   RBC 5.16 4.22 - 5.81 MIL/uL   Hemoglobin 14.5 13.0 - 17.0 g/dL   HCT 38.7 56.4 - 33.2 %   MCV 89.1 80.0 - 100.0 fL   MCH 28.1 26.0 - 34.0 pg   MCHC 31.5 30.0 - 36.0 g/dL   RDW 95.1 88.4 - 16.6 %   Platelets 268 150 - 400 K/uL   nRBC 0.0 0.0 - 0.2 %   Neutrophils Relative % 80 %   Neutro Abs 17.0 (H) 1.7 - 7.7 K/uL   Lymphocytes Relative 10 %   Lymphs Abs 2.0 0.7 - 4.0 K/uL   Monocytes Relative 9 %   Monocytes Absolute 1.9 (H) 0.1 - 1.0 K/uL   Eosinophils Relative 0 %   Eosinophils Absolute 0.0 0.0 - 0.5 K/uL   Basophils Relative 0 %   Basophils Absolute 0.0 0.0 - 0.1 K/uL   Immature Granulocytes 1 %  Abs Immature Granulocytes 0.10 (H) 0.00 - 0.07 K/uL    Comment: Performed at [facility], [address] [place], [place] [address]  Comprehensive metabolic panel     Status: Abnormal   Collection Time: 03/30/19 10:11 AM  Result Value Ref Range   Sodium 137 135 - 145 mmol/L   Potassium 4.0 3.5 - 5.1 mmol/L   Chloride 101 98 - 111 mmol/L   CO2 25 22 - 32 mmol/L   Glucose, Bld 110 (H) 70 - 99 mg/dL   BUN 8 6 - 20 mg/dL   Creatinine, Ser 2.950.85 0.61 - 1.24 mg/dL   Calcium 9.4 8.9 - 28.410.3 mg/dL   Total Protein 7.4 6.5 - 8.1 g/dL   Albumin 3.7 3.5 - 5.0 g/dL   AST 11 (L) 15 - 41 U/L   ALT 18 0 - 44 U/L   Alkaline Phosphatase 70 38 - 126 U/L   Total Bilirubin 0.9 0.3 - 1.2 mg/dL   GFR calc non Af Amer >60 >60 mL/min   GFR calc Af Amer >60 >60  mL/min   Anion gap 11 5 - 15    Comment: Performed at [facility], [address] [place], [place] [address]  Lactic acid, plasma     Status: None   Collection Time: 03/30/19 10:11 AM  Result Value Ref Range   Lactic Acid, Venous 1.0 0.5 - 1.9 mmol/L    Comment: Performed at [facility], [address] [place], [place] [address]  SARS Coronavirus 2 (CEPHEID - Performed in [facility] hospital lab), Hosp Order     Status: None   Collection Time: 03/30/19 12:14 PM  Result Value Ref Range   SARS Coronavirus 2 NEGATIVE NEGATIVE    Comment: (NOTE) If result is NEGATIVE SARS-CoV-2 target nucleic acids are NOT DETECTED. The SARS-CoV-2 RNA is generally detectable in upper and lower  respiratory specimens during the acute phase of infection. The lowest  concentration of SARS-CoV-2 viral copies this assay can detect is 250  copies / mL. A negative result does not preclude SARS-CoV-2 infection  and should not be used as the sole basis for treatment or other  patient management decisions.  A negative result may occur with  improper specimen collection / handling, submission of specimen other  than nasopharyngeal swab, presence of viral mutation(s) within the  areas targeted by this assay, and inadequate number of viral copies  (<250 copies / mL). A negative result must be combined with clinical  observations, patient history, and epidemiological information. If result is POSITIVE SARS-CoV-2 target nucleic acids are DETECTED. The SARS-CoV-2 RNA is generally detectable in upper and lower  respiratory specimens dur ing the acute phase of infection.  Positive  results are indicative of active infection with SARS-CoV-2.  Clinical  correlation with patient history and other diagnostic information is  necessary to determine patient infection status.  Positive results do  not rule out bacterial infection or co-infection with other viruses. If result is PRESUMPTIVE POSTIVE SARS-CoV-2  nucleic acids MAY BE PRESENT.   A presumptive positive result was obtained on the submitted specimen  and confirmed on repeat testing.  While 2019 novel coronavirus  (SARS-CoV-2) nucleic acids may be present in the submitted sample  additional confirmatory testing may be necessary for epidemiological  and / or clinical management purposes  to differentiate between  SARS-CoV-2 and other Sarbecovirus currently known to infect humans.  If clinically indicated additional testing with an alternate test  methodology [phone]) is advised. The SARS-CoV-2 RNA is generally  detectable in upper and lower respiratory sp ecimens during the acute  phase of infection. The expected result is Negative. Fact Sheet for Patients:  BoilerBrush.com.cy Fact Sheet for Healthcare Providers: https://pope.com/ This test is not yet approved or cleared by the [place] FDA and has been authorized for detection and/or diagnosis of SARS-CoV-2 by FDA under an Emergency Use Authorization (EUA).  This EUA will remain in effect (meaning this test can be used) for the duration of the COVID-19 declaration under Section 564(b)(1) of the Act, 21 U.S.C. section 360bbb-3(b)(1), unless the authorization is terminated or revoked sooner. Performed at [facility], [address] [place], [place] [address]   Urinalysis, Routine w reflex microscopic     Status: Abnormal   Collection Time: 03/30/19 12:58 PM  Result Value Ref Range   Color, Urine YELLOW YELLOW   APPearance CLEAR CLEAR   Specific Gravity, Urine >1.046 (H) 1.005 - 1.030   pH 5.0 5.0 - 8.0   Glucose, UA NEGATIVE NEGATIVE mg/dL   Hgb urine dipstick MODERATE (A) NEGATIVE   Bilirubin Urine NEGATIVE NEGATIVE   Ketones, ur 20 (A) NEGATIVE mg/dL   Protein, ur NEGATIVE NEGATIVE mg/dL   Nitrite NEGATIVE NEGATIVE   Leukocytes,Ua NEGATIVE NEGATIVE   RBC / HPF 6-10 0 - 5 RBC/hpf   WBC, UA 0-5 0 - 5 WBC/hpf    Bacteria, UA NONE SEEN NONE SEEN   Squamous Epithelial / LPF 0-5 0 - 5   Mucus PRESENT     Comment: Performed at [facility], [address], [place], [place] [address]   Ct Abdomen Pelvis W Contrast  Result Date: 03/30/2019 CLINICAL DATA:  Lambert Mody left lower quadrant pain since last night. Diarrhea. EXAM: CT ABDOMEN AND PELVIS WITH CONTRAST TECHNIQUE: Multidetector CT imaging of the abdomen and pelvis was performed using the standard protocol following bolus administration of intravenous contrast. CONTRAST:  OMNIPAQUE IOHEXOL 300 MG/ML  SOLN COMPARISON:  07/30/2012 FINDINGS: Lower chest: Lung bases are clear. Hepatobiliary: Scattered hepatic cysts measuring up to 9 mm. Gallbladder is unremarkable. No intrahepatic or extrahepatic ductal dilatation. Pancreas: Within normal limits. Spleen: Within normal limits. Adrenals/Urinary Tract: Adrenal glands are within normal limits. Kidneys are within normal limits.  No hydronephrosis. Bladder is mildly thick-walled although underdistended. Stomach/Bowel: Stomach is within normal limits. No evidence of bowel obstruction. Mobile cecum in the left mid abdomen. Normal appendix (series 3/image 47). Left colonic diverticulosis with associated wall thickening/inflammatory changes in the left lower quadrant, reflecting acute diverticulitis (series 3/image 50). Mild localized perforation with pericolonic gas (coronal images 38 and 40). Associated mild mesenteric stranding. No drainable fluid collection/abscess. No free air. Vascular/Lymphatic: No evidence of abdominal aortic aneurysm. No suspicious abdominopelvic lymphadenopathy. Reproductive: Prostate is unremarkable. Other: No abdominopelvic ascites. Musculoskeletal: Visualized osseous structures are within normal limits. IMPRESSION: Acute left colonic diverticulitis. Mild localized perforation with pericolonic gas and mesenteric stranding. No drainable fluid collection/abscess. No free air. Electronically Signed    By: Charline Bills M.D.   On: 03/30/2019 12:02    Pending Labs Unresulted Labs (From admission, onward)    Start     Ordered   04/06/19 0500  Creatinine, serum  (enoxaparin (LOVENOX)    CrCl >/= 30 ml/min)  Weekly,   R    Comments:  while on enoxaparin therapy    03/30/19 1356   03/31/19 0500  Basic metabolic panel  Tomorrow morning,   R  03/30/19 1356   03/31/19 0500  CBC  Tomorrow morning,   R     03/30/19 1356   03/30/19 1351  HIV antibody (Routine Testing)  Once,   R     03/30/19 1356          Vitals/Pain Today's Vitals   03/30/19 1155 03/30/19 1213 03/30/19 1230 03/30/19 1352  BP:  120/87 130/80   Pulse:  94 90   Resp:  15 14   Temp:      TempSrc:      SpO2:  98% 98%   Weight:      Height:      PainSc: 8    1     Isolation Precautions No active isolations  Medications Medications  ceFEPIme (MAXIPIME) 2 g in sodium chloride 0.9 % 100 mL IVPB (0 g Intravenous Stopped 03/30/19 1346)    And  metroNIDAZOLE (FLAGYL) IVPB 500 mg (500 mg Intravenous New Bag/Given 03/30/19 1351)  enoxaparin (LOVENOX) injection 40 mg (has no administration in time range)  sodium chloride flush (NS) 0.9 % injection 3 mL (3 mLs Intravenous Not Given 03/30/19 1403)  lactated ringers infusion (has no administration in time range)  acetaminophen (TYLENOL) tablet 650 mg (has no administration in time range)    Or  acetaminophen (TYLENOL) suppository 650 mg (has no administration in time range)  ondansetron (ZOFRAN) tablet 4 mg (has no administration in time range)    Or  ondansetron (ZOFRAN) injection 4 mg (has no administration in time range)  HYDROmorphone (DILAUDID) injection 1 mg (has no administration in time range)  metroNIDAZOLE (FLAGYL) IVPB 500 mg (has no administration in time range)  ceFEPIme (MAXIPIME) 2 g in sodium chloride 0.9 % 100 mL IVPB (has no administration in time range)  sodium chloride 0.9 % bolus 1,000 mL (0 mLs Intravenous Stopped 03/30/19 1218)  morphine 4  MG/ML injection 4 mg (4 mg Intravenous Given 03/30/19 1028)  ondansetron (ZOFRAN) injection 4 mg (4 mg Intravenous Given 03/30/19 1028)  iohexol (OMNIPAQUE) 300 MG/ML solution 100 mL (100 mLs Intravenous Contrast Given 03/30/19 1130)  HYDROmorphone (DILAUDID) injection 1 mg (1 mg Intravenous Given 03/30/19 1228)    Mobility walks Low fall risk   Focused Assessments   R Recommendations: See Admitting Provider Note  Report given to:   Additional Notes:

## 2019-03-30 NOTE — ED Notes (Signed)
Patient transported to CT 

## 2019-03-30 NOTE — Discharge Summary (Signed)
Name: Grant Mitchell MRN: [id number] DOB: September 17, 1966 53 y.o. PCP: Patient, No Pcp Per  Date of Admission: 03/30/2019  9:23 AM Date of Discharge: 04/02/2019 Attending Physician: Earl Lagos, MD  Discharge Diagnosis: 1. Diverticulitis with mild perforation 2. Microscopic hematuria  Discharge Medications: Allergies as of 04/02/2019      Reactions   Ibuprofen     high dose upset stomach and stomach pain   Penicillins Other (See Comments)   Doesn't remember, was when younger and had to go to the hospital      Medication List    STOP taking these medications   methocarbamol 750 MG tablet Commonly known as:  ROBAXIN   oxyCODONE-acetaminophen 5-325 MG tablet Commonly known as:  PERCOCET/ROXICET     TAKE these medications   aspirin 325 MG tablet Take 325 mg by mouth once.   ciprofloxacin 500 MG tablet Commonly known as:  Cipro Take 1 tablet (500 mg total) by mouth 2 (two) times daily for 10 days. What changed:  when to take this   metroNIDAZOLE 500 MG tablet Commonly known as:  FLAGYL Take 1 tablet (500 mg total) by mouth 3 (three) times daily for 10 days. What changed:  when to take this   oxyCODONE 5 MG immediate release tablet Commonly known as:  Oxy IR/ROXICODONE Take 1 tablet (5 mg total) by mouth every 6 (six) hours as needed for up to 3 days for moderate pain.       Disposition and follow-up:   Grant Mitchell was discharged from [facility] in Good condition.  At the hospital follow up visit please address:  1. Diverticulitis with mild perforation: Discharged on ciprofloxacin and metronidazole to complete total 14 day course of antibiotics. Please repeat labs and assess abdominal pain.  Microscopic hematuria: Noted on U/A, hx of smoking. Please repeat u/a and consider urology referral.   2.  Labs / imaging needed at time of follow-up: U/A, CBC, BMP  3.  Pending labs/ test needing follow-up:   Follow-up Appointments: Follow-up  Information    Kallie Locks, FNP. Go to.   Specialty:  Family Medicine Why:  Friday Apr 11, 2019 1:00 PM  Contact information: [address] [place] [address] [phone]           Hospital Course by problem list:  1.  Diverticulitis of left colon with perforation: This is a 53 year old male with history ofan "infection of the colon" in 2013,no other significant past medical history, who presented with a 3-day history of left lower quadrant pain, nausea, decreased appetite. He was found to be hemodynamically stable, afebrile, labs showed a leukocytosis of 21. CT abdomen showed a left colonic diverticulitis with mild localized perforation, no obvious fluid collection or abscess formation. Was started on cefepime and metronidazole, kept n.p.o, and pain control with dilaudid. Over hospitalization patient was able to tolerate diet, leukocytosis improved, and abdominal exam improved. Antibiotics switched from cefepime to ceftriaxone. Patient was discharged on ciprofloxacin and metronidazole to complete a 14 day course of antibiotics, given a 3 day course of Oxy IR for pain control.   2.  Microscopic hematuria: UA showing moderate hgb and rbc elevated 6-10.  Patient is afebrile, no nitrites, leukocyte esterase to suggest UTI.  There are no casts present or hypoalbuminemia.  Patient's UA should be repeated in a few weeks. Given that the patient is a current smoker, should consider evaluation for bladder cancer.   Discharge Vitals:   BP 119/67 (  BP Location: Right Arm)   Pulse 60   Temp 98.7 F (37.1 C) (Oral)   Resp 16   Ht 5\' 7"  (1.702 m)   Wt 68.9 kg   SpO2 98%   BMI 23.81 kg/m   Pertinent Labs, Studies, and Procedures:   CBC Latest Ref Rng & Units 04/02/2019 04/01/2019 03/31/2019  WBC 4.0 - 10.5 K/uL 11.8(H) 12.6(H) 18.5(H)  Hemoglobin 13.0 - 17.0 g/dL 12.2(L) 11.4(L) 12.1(L)  Hematocrit 39.0 - 52.0 % 37.4(L) 35.7(L) 38.1(L)  Platelets 150 - 400 K/uL 252 225 214   BMP  Latest Ref Rng & Units 03/31/2019 03/30/2019 07/30/2012  Glucose 70 - 99 mg/dL 88 453(M) 468(E)  BUN 6 - 20 mg/dL 10 8 10   Creatinine 0.61 - 1.24 mg/dL 3.21 2.24 8.25  Sodium 135 - 145 mmol/L 136 137 136  Potassium 3.5 - 5.1 mmol/L 3.9 4.0 3.8  Chloride 98 - 111 mmol/L 102 101 97  CO2 22 - 32 mmol/L 25 25 27   Calcium 8.9 - 10.3 mg/dL 0.0(B) 9.4 70.4   8/88/91 CT Abd/Pelvis: IMPRESSION: Acute left colonic diverticulitis. Mild localized perforation with pericolonic gas and mesenteric stranding. No drainable fluid collection/abscess. No free air.  Discharge Instructions: Discharge Instructions    Call MD for:  difficulty breathing, headache or visual disturbances   Complete by:  As directed    Call MD for:  extreme fatigue   Complete by:  As directed    Call MD for:  hives   Complete by:  As directed    Call MD for:  persistant dizziness or light-headedness   Complete by:  As directed    Call MD for:  persistant nausea and vomiting   Complete by:  As directed    Call MD for:  redness, tenderness, or signs of infection (pain, swelling, redness, odor or green/yellow discharge around incision site)   Complete by:  As directed    Call MD for:  severe uncontrolled pain   Complete by:  As directed    Call MD for:  temperature >100.4   Complete by:  As directed    Diet - low sodium heart healthy   Complete by:  As directed    Discharge instructions   Complete by:  As directed    [name],   It has been a pleasure working with you and we are glad you're feeling better. You were hospitalized for diverticulitis, an infection in your gut. We have treated you with antibiotics and some fluids. Please continue to take the antibiotics for the next 10 days, it will be important that you complete the whole 10 days. We have also given you a prescription for some pain medications. I have sent the prescriptions to the [facility].   Please start taking Ciprofloxacin 500 mg twice a day Please  start taking metronidazole 500 mg three times a day  You were also noted to have a small amount of blood in your urine, there are many things that can cause this so it will be important for you to follow up with a primary care provider to have this further evaluated. You may need to see a urologist in the future.    Follow up with your primary care provider in 1 weeks  If your symptoms worsen or you develop new symptoms, please seek medical help whether it is your primary care provider or emergency department.  If you have any questions about this hospitalization please call [phone].   Increase activity slowly  Complete by:  As directed       Signed: Claudean SeveranceKrienke, Marissa M, MD 04/05/2019, 11:30 AM   Pager: [phone]

## 2019-03-30 NOTE — Plan of Care (Signed)
  Problem: Clinical Measurements: Goal: Ability to maintain clinical measurements within normal limits will improve Outcome: Progressing   Problem: Pain Managment: Goal: General experience of comfort will improve Outcome: Progressing   Problem: Nutrition: Goal: Adequate nutrition will be maintained Outcome: Not Progressing   

## 2019-03-30 NOTE — ED Notes (Signed)
Pt verbalizes ability to update family on his status 

## 2019-03-30 NOTE — ED Triage Notes (Signed)
Pt in with sharp LLQ pain since last night. Pt denies n/v, but had diarrhea x 1 last night. Denies any fevers, sob or cp

## 2019-03-30 NOTE — ED Triage Notes (Signed)
Patient reports LLQ pain onset Friday while at work, has progressed since then and is now 10/10, constant, stabbing pain that now feels like it's radiating across lower abdomen. He denies N/V, fevers/chills, urinary symptoms. LLQ very tender to palpation.

## 2019-03-31 DIAGNOSIS — R7989 Other specified abnormal findings of blood chemistry: Secondary | ICD-10-CM

## 2019-03-31 DIAGNOSIS — Z87891 Personal history of nicotine dependence: Secondary | ICD-10-CM

## 2019-03-31 DIAGNOSIS — R3129 Other microscopic hematuria: Secondary | ICD-10-CM

## 2019-03-31 DIAGNOSIS — K5720 Diverticulitis of large intestine with perforation and abscess without bleeding: Principal | ICD-10-CM

## 2019-03-31 DIAGNOSIS — Z8719 Personal history of other diseases of the digestive system: Secondary | ICD-10-CM

## 2019-03-31 DIAGNOSIS — K5792 Diverticulitis of intestine, part unspecified, without perforation or abscess without bleeding: Secondary | ICD-10-CM

## 2019-03-31 HISTORY — DX: Other specified abnormal findings of blood chemistry: R79.89

## 2019-03-31 LAB — GLUCOSE, CAPILLARY: Glucose-Capillary: 85 mg/dL (ref 70–99)

## 2019-03-31 LAB — CBC
HCT: 38.1 % — ABNORMAL LOW (ref 39.0–52.0)
Hemoglobin: 12.1 g/dL — ABNORMAL LOW (ref 13.0–17.0)
MCH: 28.2 pg (ref 26.0–34.0)
MCHC: 31.8 g/dL (ref 30.0–36.0)
MCV: 88.8 fL (ref 80.0–100.0)
Platelets: 214 K/uL (ref 150–400)
RBC: 4.29 MIL/uL (ref 4.22–5.81)
RDW: 13.2 % (ref 11.5–15.5)
WBC: 18.5 K/uL — ABNORMAL HIGH (ref 4.0–10.5)
nRBC: 0 % (ref 0.0–0.2)

## 2019-03-31 LAB — BASIC METABOLIC PANEL
Anion gap: 9 (ref 5–15)
BUN: 10 mg/dL (ref 6–20)
CO2: 25 mmol/L (ref 22–32)
Calcium: 8.7 mg/dL — ABNORMAL LOW (ref 8.9–10.3)
Chloride: 102 mmol/L (ref 98–111)
Creatinine, Ser: 1.01 mg/dL (ref 0.61–1.24)
GFR calc Af Amer: >60 mL/min (ref >60)
GFR calc non Af Amer: >60 mL/min (ref >60)
Glucose, Bld: 88 mg/dL (ref 70–99)
Potassium: 3.9 mmol/L (ref 3.5–5.1)
Sodium: 136 mmol/L (ref 135–145)

## 2019-03-31 MED ORDER — SODIUM CHLORIDE 0.9 % IV SOLN
2.0000 g | INTRAVENOUS | Status: DC
  Administered 2019-03-31 – 2019-04-01 (×2): 2 g via INTRAVENOUS
  Filled 2019-03-31 (×3): qty 20

## 2019-03-31 NOTE — Progress Notes (Signed)
   Subjective: He states that he has been feeling better, no acute events overnight. His LLQabdominal pain has continued and his is unsure whether it has improved. He denies nausea, vomiting, fevers, and chills. We discussed advancing his diet and he is amenable to this. All questions and concerns have been answered.  Objective:  Vital signs in last 24 hours: Vitals:   03/30/19 1420 03/30/19 1502 03/30/19 2039 03/31/19 0419  BP: 129/71 118/72 118/68 111/70  Pulse: 88 86 96 81  Resp: 14 16 18 18   Temp:  99.8 F (37.7 C) 100.2 F (37.9 C) 98.7 F (37.1 C)  TempSrc:  Oral Oral Oral  SpO2: 95% 100% 96% 96%  Weight:      Height:        General: Well-appearing, no acute distress, sitting comfortably in bed Cardiac: Rate and rhythm, no murmurs rubs or gallops Pulmonary: CTA BL, no wheezing, rhonchi, or rales Abdomen: Soft, tender to palpation over left lower quadrant, no rebound, hypoactive bowel sounds, no guarding Extremity: No lower extremity edema  Assessment/Plan:  Active Problems:   Diverticulitis of colon with perforation  This is a 53 year old male with history of an "infection of the colon" in 2013, no other significant past medical history, who presented with a 3-day history of left lower quadrant pain, nausea, decreased appetite.  He was found to be hemodynamically stable, afebrile, labs showed a leukocytosis of 21.  CT abdomen showed a left colonic diverticulitis with mild localized perforation, no obvious fluid collection or abscess formation.  Diverticulitis with mild perforation: Noted on CT scan, patient had a history of a colon infection in the past. Was made n.p.o. and given lactated Ringer's, he reports that he may have had some improvement in symptoms but is unsure. He has received cefepime and metronidazole.  He remains afebrile, leukocytosis improved to 18 from 21. No acute abdomen on exam.  Will adjust his antibiotic regimen and see if he tolerates a clear liquid  diet.  Does not appear to be in need for surgery consult at this time, however will monitor closely and if symptoms worsen or he becomes hemodynamically unstable we will consult them.  -Switch from cefepime to ceftriaxone (day 2 of antibiotics) -Continue metronidazole -Start CLD as tolerated -Continue dilaudid 1 mg every 4 hours as needed -Discontinue fluids -CBC in AM  Microcytic hematuria: -Noted in his urinalysis, patient has a history of smoking. Labs today show a small drop in Hgb to 12.1 from 14, however he did receive LR overnight which likely caused a dilutional drop. Will need to repeat urinalysis on hospital follow-up, may need urology referral if hematuria persists.  FEN: No fluids, replete lytes prn, CLD VTE ppx: Lovenox  Code Status: FULL   Dispo: Anticipated discharge in approximately 1-3 day(s).   Claudean Severance, MD 03/31/2019, 6:45 AM Pager: [phone]

## 2019-03-31 NOTE — Progress Notes (Signed)
Pharmacy Antibiotic Note  Grant Mitchell is a 53 y.o. male admitted on 03/30/2019 with diverticulitis. Patient initially placed on Cefepime and Metronidazole. Cefepime now being switched to Ceftriaxone. Pharmacy has been consulted for Ceftriaxone dosing. Noted penicillin allergy (has tolerated Cefepime).   Plan: Ceftriaxone 2g IV q24h Continue Metronidazole 500mg  IV q8h per MD Need for further dosage adjustment appears unlikely at present, so pharmacy will sign off at this time.  Please reconsult if a change in clinical status warrants re-evaluation of dosage.   Height: 5\' 7"  (170.2 cm) Weight: 152 lb (68.9 kg) IBW/kg (Calculated) : 66.1  Temp (24hrs), Avg:99.6 F (37.6 C), Min:98.7 F (37.1 C), Max:100.2 F (37.9 C)  Recent Labs  Lab 03/30/19 1011 03/31/19 0321  WBC 21.1* 18.5*  CREATININE 0.85 1.01  LATICACIDVEN 1.0  --     Estimated Creatinine Clearance: 78.2 mL/min (by C-G formula based on SCr of 1.01 mg/dL).    Allergies  Allergen Reactions  . Ibuprofen     800mg  high dose upset stomach and stomach pain  . Penicillins Other (See Comments)    Doesn't remember, was when younger and had to go to the hospital     Thank you for allowing pharmacy to be a part of this patient's care.   Greer Pickerel, PharmD, BCPS Pager: [phone] 03/31/2019 11:41 AM

## 2019-04-01 ENCOUNTER — Encounter (HOSPITAL_COMMUNITY): Payer: Self-pay | Admitting: General Practice

## 2019-04-01 LAB — CBC
HCT: 35.7 % — ABNORMAL LOW (ref 39.0–52.0)
Hemoglobin: 11.4 g/dL — ABNORMAL LOW (ref 13.0–17.0)
MCH: 28.1 pg (ref 26.0–34.0)
MCHC: 31.9 g/dL (ref 30.0–36.0)
MCV: 88.1 fL (ref 80.0–100.0)
Platelets: 225 10*3/uL (ref 150–400)
RBC: 4.05 MIL/uL — ABNORMAL LOW (ref 4.22–5.81)
RDW: 12.7 % (ref 11.5–15.5)
WBC: 12.6 10*3/uL — ABNORMAL HIGH (ref 4.0–10.5)
nRBC: 0 % (ref 0.0–0.2)

## 2019-04-01 LAB — GLUCOSE, CAPILLARY: Glucose-Capillary: 93 mg/dL (ref 70–99)

## 2019-04-01 LAB — HIV ANTIBODY (ROUTINE TESTING W REFLEX): HIV Screen 4th Generation wRfx: NONREACTIVE

## 2019-04-01 NOTE — Progress Notes (Signed)
   Subjective: Mr. Norona reports that he is doing okay today, no acute events overnight. He ate broth around lunchtime yesterday and subsequently had some abdominal pain. He has not eaten since then for fear that he will have again have abdominal pain. He is hungry. He has been able to drink apple juice without any abd pain. He denies nausea and vomiting. He does think that his symptoms have also improved and wants to go home.   Objective:  Vital signs in last 24 hours: Vitals:   03/31/19 0419 03/31/19 1332 03/31/19 1925 04/01/19 0640  BP: 111/70 130/65 115/62 114/65  Pulse: 81 66 66 63  Resp: 18 16 16 16   Temp: 98.7 F (37.1 C) 98.8 F (37.1 C) 98.4 F (36.9 C) 98.6 F (37 C)  TempSrc: Oral Oral Oral Oral  SpO2: 96% 99% 99% 98%  Weight:      Height:        General: Lying in bed in no acute distress Cardiac: Normal rate, regular rhythm Pulmonary: CTAB, normal WOB Abdomen: Normal BSs, TTP of the LLQ, no rebound or guarding.  Extremity:No LE edema, erythema, or warmth noted bilaterally Psychiatry: Normal affect, mood, and behavior.   Assessment/Plan:  Active Problems:   Diverticulitis of colon with perforation   Diverticulitis   This is a 53 year old male with history of an "infection of the colon" in 2013, no other significant past medical history, who presented with a 3-day history of left lower quadrant pain, nausea, decreased appetite.  He was found to be hemodynamically stable, afebrile, labs showed a leukocytosis of 21.  CT abdomen showed a left colonic diverticulitis with mild localized perforation, no obvious fluid collection or abscess formation.  Diverticulitis with mild perforation: Patient reported improvement in his abdominal pain, however had pain with drinking broth yesterday but was able to tolerate apple juice today. He still has tenderness to palpation in his left lower quadrant, no rebound or guarding noted, and has normoactive bowel sounds.  Was switched to  ceftriaxone to cefepime yesterday, continued on metronidazole.  He has remained afebrile and his white count continues to improve however given his minimal improvement of exam he still may require IV antibiotics.  We will advance diet today to assess his tolerance, if he has some improvement in exam and able to tolerate a diet he may be going to be discharged today.  Will reevaluate in afternoon, if he has improvement will discharge on Augmentin to complete a 14-day course of antibiotics. -Continue ceftriaxone (day 3 of antibiotics) -Continue metronidazole -Advance to full liquid diet -Continue dilaudid 1 mg every 4 hours as needed -CBC in AM  Microcytic hematuria: -Noted in his urinalysis, patient has a history of smoking. Will need repeat urinalysis on follow up and possible urology follow up.    FEN: No fluids, replete lytes prn, CLD VTE ppx: Lovenox  Code Status: FULL   Dispo: Anticipated discharge is pending clinical improvement.   Synetta Shadow, MD 04/01/2019, 6:57 AM Pager: [phone]

## 2019-04-02 LAB — CBC
HCT: 37.4 % — ABNORMAL LOW (ref 39.0–52.0)
Hemoglobin: 12.2 g/dL — ABNORMAL LOW (ref 13.0–17.0)
MCH: 28.2 pg (ref 26.0–34.0)
MCHC: 32.6 g/dL (ref 30.0–36.0)
MCV: 86.4 fL (ref 80.0–100.0)
Platelets: 252 10*3/uL (ref 150–400)
RBC: 4.33 MIL/uL (ref 4.22–5.81)
RDW: 12.6 % (ref 11.5–15.5)
WBC: 11.8 10*3/uL — ABNORMAL HIGH (ref 4.0–10.5)
nRBC: 0 % (ref 0.0–0.2)

## 2019-04-02 LAB — GLUCOSE, CAPILLARY: Glucose-Capillary: 82 mg/dL (ref 70–99)

## 2019-04-02 MED ORDER — CIPROFLOXACIN HCL 500 MG PO TABS: 500.0000 mg | ORAL_TABLET | Freq: Two times a day (BID) | ORAL | 0 refills | Status: AC

## 2019-04-02 MED ORDER — METRONIDAZOLE 500 MG PO TABS: 500.0000 mg | ORAL_TABLET | Freq: Three times a day (TID) | ORAL | 0 refills | Status: AC

## 2019-04-02 MED ORDER — OXYCODONE HCL 5 MG PO TABS: 5.0000 mg | ORAL_TABLET | Freq: Four times a day (QID) | ORAL | 0 refills | Status: AC | PRN

## 2019-04-02 MED ORDER — OXYCODONE HCL 5 MG PO TABS: 5.0000 mg | ORAL_TABLET | Freq: Four times a day (QID) | ORAL | Status: DC | PRN

## 2019-04-02 NOTE — Discharge Instructions (Signed)
Grant Mitchell,   It has been a pleasure working with you and we are glad you're feeling better. You were hospitalized for diverticulitis, an infection in your gut. We have treated you with antibiotics and some fluids. Please continue to take the antibiotics for the next 10 days, it will be important that you complete the whole 10 days. We have also given you a prescription for some pain medications. I have sent the prescriptions to the [facility].   Please start taking Ciprofloxacin 500 mg twice a day Please start taking metronidazole 500 mg three times a day  You were also noted to have a small amount of blood in your urine, there are many things that can cause this so it will be important for you to follow up with a primary care provider to have this further evaluated. You may need to see a urologist in the future.    Follow up with your primary care provider in 1 weeks  If your symptoms worsen or you develop new symptoms, please seek medical help whether it is your primary care provider or emergency department.  If you have any questions about this hospitalization please call [phone].

## 2019-04-02 NOTE — Progress Notes (Signed)
Patient discharged to home with instructions. 

## 2019-04-02 NOTE — TOC Initial Note (Signed)
Transition of Care [facility]) - Initial/Assessment Note    Patient Details  Name: Grant Mitchell MRN: [id number] Date of Birth: 01/19/1966  [facility]) CM/SW Contact:    Kermit Balo, RN Phone Number: 04/02/2019, 9:10 AM  Clinical Narrative:                 CM consulted for PCP. Pt interested in having CM assist in finding a PCP. CM was able to obtain appt at [facility]. Information on the AVS. Pt may also use [facility] pharmacy for medication assistance.   Expected Discharge Plan: Home/Self Care Barriers to Discharge: Continued Medical Work up, Other (comment)(NO PCP)   Patient Goals and CMS Choice        Expected Discharge Plan and Services Expected Discharge Plan: Home/Self Care                                              Prior Living Arrangements/Services     Patient language and need for interpreter reviewed:: Yes(no needs) Do you feel safe going back to the place where you live?: Yes            Criminal Activity/Legal Involvement Pertinent to Current Situation/Hospitalization: No - Comment as needed  Activities of Daily Living Home Assistive Devices/Equipment: None ADL Screening (condition at time of admission) Patient's cognitive ability adequate to safely complete daily activities?: Yes Is the patient deaf or have difficulty hearing?: No Does the patient have difficulty seeing, even when wearing glasses/contacts?: No Does the patient have difficulty concentrating, remembering, or making decisions?: No Patient able to express need for assistance with ADLs?: Yes Does the patient have difficulty dressing or bathing?: No Independently performs ADLs?: Yes (appropriate for developmental age) Does the patient have difficulty walking or climbing stairs?: No Weakness of Legs: None Weakness of Arms/Hands: None  Permission Sought/Granted                  Emotional Assessment Appearance:: Appears stated age Attitude/Demeanor/Rapport:  Engaged Affect (typically observed): Accepting Orientation: : Oriented to Self, Oriented to Place, Oriented to  Time, Oriented to Situation   Psych Involvement: No (comment)  Admission diagnosis:  Diverticulitis [K57.92] Patient Active Problem List   Diagnosis Date Noted  . Diverticulitis   . Diverticulitis of colon with perforation 03/30/2019   PCP:  Patient, No Pcp Per Pharmacy:   [id number] - [place], [place] - [address] AT [facility] [address] [place] [address] Phone: [phone] Fax: [phone]     Social Determinants of Health (SDOH) Interventions    Readmission Risk Interventions No flowsheet data found.
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
## 2019-04-07 ENCOUNTER — Encounter: Payer: Self-pay | Admitting: Internal Medicine

## 2019-04-07 ENCOUNTER — Telehealth: Payer: Self-pay | Admitting: Internal Medicine

## 2019-04-07 NOTE — Telephone Encounter (Signed)
I wrote a letter that he can return to work as soon as possible. Thank you.

## 2019-04-07 NOTE — Telephone Encounter (Signed)
Pt requesting a note to return to work as soon as possible. Pt D/C on Dr. Gwynneth Macleod [facility].  Pt is sch with Dr Clovis Riley on 04/11/2019 and needs a note to reflect that information.

## 2019-04-10 ENCOUNTER — Telehealth: Payer: Self-pay

## 2019-04-10 NOTE — Telephone Encounter (Signed)
Left a vm for patient to callback and do screening 

## 2019-04-11 ENCOUNTER — Other Ambulatory Visit: Payer: Self-pay

## 2019-04-11 ENCOUNTER — Encounter: Payer: Self-pay | Admitting: Family Medicine

## 2019-04-11 ENCOUNTER — Ambulatory Visit (INDEPENDENT_AMBULATORY_CARE_PROVIDER_SITE_OTHER): Payer: No Typology Code available for payment source | Admitting: Family Medicine

## 2019-04-11 VITALS — BP 104/68 | HR 88 | Temp 98.1°F | Ht 67.0 in | Wt 147.0 lb

## 2019-04-11 DIAGNOSIS — Z09 Encounter for follow-up examination after completed treatment for conditions other than malignant neoplasm: Secondary | ICD-10-CM | POA: Diagnosis not present

## 2019-04-11 DIAGNOSIS — Z0000 Encounter for general adult medical examination without abnormal findings: Secondary | ICD-10-CM

## 2019-04-11 DIAGNOSIS — Z1211 Encounter for screening for malignant neoplasm of colon: Secondary | ICD-10-CM

## 2019-04-11 DIAGNOSIS — Z131 Encounter for screening for diabetes mellitus: Secondary | ICD-10-CM | POA: Diagnosis not present

## 2019-04-11 DIAGNOSIS — K5792 Diverticulitis of intestine, part unspecified, without perforation or abscess without bleeding: Secondary | ICD-10-CM | POA: Diagnosis not present

## 2019-04-11 DIAGNOSIS — Z7689 Persons encountering health services in other specified circumstances: Secondary | ICD-10-CM | POA: Diagnosis not present

## 2019-04-11 LAB — POCT URINALYSIS DIP (MANUAL ENTRY)
Bilirubin, UA: NEGATIVE
Blood, UA: NEGATIVE
Glucose, UA: NEGATIVE mg/dL
Ketones, POC UA: NEGATIVE mg/dL
Nitrite, UA: NEGATIVE
Protein Ur, POC: NEGATIVE mg/dL
Spec Grav, UA: 1.025 (ref 1.010–1.025)
Urobilinogen, UA: 0.2 E.U./dL
pH, UA: 5.5 (ref 5.0–8.0)

## 2019-04-11 LAB — POCT GLYCOSYLATED HEMOGLOBIN (HGB A1C): Hemoglobin A1C: 5.8 % — AB (ref 4.0–5.6)

## 2019-04-11 NOTE — Patient Instructions (Signed)
Diverticulitis  Diverticulitis is when small pockets in your large intestine (colon) get infected or swollen. This causes stomach pain and watery poop (diarrhea). These pouches are called diverticula. They form in people who have a condition called diverticulosis. Follow these instructions at home: Medicines  Take over-the-counter and prescription medicines only as told by your doctor. These include: ? Antibiotics. ? Pain medicines. ? Fiber pills. ? Probiotics. ? Stool softeners.  Do not drive or use heavy machinery while taking prescription pain medicine.  If you were prescribed an antibiotic, take it as told. Do not stop taking it even if you feel better. General instructions   Follow a diet as told by your doctor.  When you feel better, your doctor may tell you to change your diet. You may need to eat a lot of fiber. Fiber makes it easier to poop (have bowel movements). Healthy foods with fiber include: ? Berries. ? Beans. ? Lentils. ? Green vegetables.  Exercise 3 or more times a week. Aim for 30 minutes each time. Exercise enough to sweat and make your heart beat faster.  Keep all follow-up visits as told. This is important. You may need to have an exam of the large intestine. This is called a colonoscopy. Contact a doctor if:  Your pain does not get better.  You have a hard time eating or drinking.  You are not pooping like normal. Get help right away if:  Your pain gets worse.  Your problems do not get better.  Your problems get worse very fast.  You have a fever.  You throw up (vomit) more than one time.  You have poop that is: ? Bloody. ? Black. ? Tarry. Summary  Diverticulitis is when small pockets in your large intestine (colon) get infected or swollen.  Take medicines only as told by your doctor.  Follow a diet as told by your doctor. This information is not intended to replace advice given to you by your health care provider. Make sure you  discuss any questions you have with your health care provider. Document Released: 04/03/2008 Document Revised: 11/02/2016 Document Reviewed: 11/02/2016 Elsevier Interactive Patient Education  2019 Elsevier Inc.  

## 2019-04-11 NOTE — Progress Notes (Signed)
[facility]   New Patient--Hospital Follow Up--Establish Care  Subjective:  Patient ID: Grant Mitchell, male    DOB: 1965-12-28  Age: 53 y.o. MRN: [id number]  CC:  Chief Complaint  Patient presents with  . Establish Care  . Hospitalization Follow-up    HPI Grant Mitchell is a 53 year old male who presents to [facility] today.   Past Medical History:  Diagnosis Date  . Decreased thyroid stimulating hormone (TSH) level 03/2019  . Diverticulitis    Current Status: Since his last Hospital Admission on 03/30/2019-04/02/2019 for Diverticulitis, he is doing well with no complaints. He has generalized left lower quadrant pain. He smokes 1 pack of cigarettes in 1-2 days. Denies any GI problems such as nausea, vomiting, diarrhea, and constipation. He has no reports of blood in stools, dysuria and hematuria.  He denies fevers, chills, fatigue, recent infections, weight loss, and night sweats. He has not had any headaches, visual changes, dizziness, and falls. No chest pain, heart palpitations, cough and shortness of breath reported.  No depression or anxiety, and denies suicidal ideations, homicidal ideations, or auditory hallucinations. He denies pain today.   Past Surgical History:  Procedure Laterality Date  . NO PAST SURGERIES      Family History  Problem Relation Age of Onset  . Cancer Mother   . Heart attack Father     Social History   Socioeconomic History  . Marital status: Single    Spouse name: Not on file  . Number of children: Not on file  . Years of education: Not on file  . Highest education level: Not on file  Occupational History  . Not on file  Social Needs  . Financial resource strain: Not on file  . Food insecurity    Worry: Not on file    Inability: Not on file  . Transportation needs    Medical: Not on file    Non-medical: Not on file  Tobacco Use  . Smoking status: Current Every Day Smoker   Packs/day: 1.00    Types: Cigarettes  . Smokeless tobacco: Never Used  Substance and Sexual Activity  . Alcohol use: No  . Drug use: Yes    Types: Marijuana  . Sexual activity: Not on file  Lifestyle  . Physical activity    Days per week: Not on file    Minutes per session: Not on file  . Stress: Not on file  Relationships  . Social Herbalist on phone: Not on file    Gets together: Not on file    Attends religious service: Not on file    Active member of club or organization: Not on file    Attends meetings of clubs or organizations: Not on file    Relationship status: Not on file  . Intimate partner violence    Fear of current or ex partner: Not on file    Emotionally abused: Not on file    Physically abused: Not on file    Forced sexual activity: Not on file  Other Topics Concern  . Not on file  Social History Narrative  . Not on file    Outpatient Medications Prior to Visit  Medication Sig Dispense Refill  . ciprofloxacin (CIPRO) 500 MG tablet Take 1 tablet (500 mg total) by mouth 2 (two) times daily for 10 days. 20 tablet 0  . metroNIDAZOLE (FLAGYL) 500 MG tablet Take 1 tablet (500 mg  total) by mouth 3 (three) times daily for 10 days. 30 tablet 0  . Multiple Vitamin (MULTIVITAMIN) tablet Take 1 tablet by mouth daily.    Marland Kitchen. aspirin 325 MG tablet Take 325 mg by mouth once.     No facility-administered medications prior to visit.     Allergies  Allergen Reactions  . Ibuprofen     800mg  high dose upset stomach and stomach pain  . Penicillins Other (See Comments)    Doesn't remember, was when younger and had to go to the hospital    ROS Review of Systems  Constitutional: Negative.   HENT: Negative.   Eyes: Negative.   Respiratory: Negative.   Cardiovascular: Negative.   Gastrointestinal: Positive for abdominal pain (mild left lower quadrant ).  Endocrine: Negative.   Genitourinary: Negative.   Musculoskeletal: Negative.   Skin: Negative.    Allergic/Immunologic: Negative.   Neurological: Negative.   Hematological: Negative.   Psychiatric/Behavioral: Negative.    Objective:    Physical Exam  Constitutional: He is oriented to person, place, and time. He appears well-developed and well-nourished.  HENT:  Head: Normocephalic and atraumatic.  Eyes: Conjunctivae are normal.  Neck: Normal range of motion. Neck supple.  Cardiovascular: Normal rate, regular rhythm, normal heart sounds and intact distal pulses.  Pulmonary/Chest: Effort normal and breath sounds normal.  Abdominal: Soft. Bowel sounds are normal.  Musculoskeletal: Normal range of motion.  Neurological: He is alert and oriented to person, place, and time. He has normal reflexes.  Skin: Skin is warm and dry.  Psychiatric: He has a normal mood and affect. His behavior is normal. Judgment and thought content normal.  Vitals reviewed.   BP 104/68 (BP Location: Left Arm, Patient Position: Sitting, Cuff Size: Small)   Pulse 88   Temp 98.1 F (36.7 C) (Oral)   Ht 5\' 7"  (1.702 m)   Wt 147 lb (66.7 kg)   SpO2 98%   BMI 23.02 kg/m  Wt Readings from Last 3 Encounters:  04/11/19 147 lb (66.7 kg)  03/30/19 152 lb (68.9 kg)  12/12/17 150 lb (68 kg)     Health Maintenance Due  Topic Date Due  . TETANUS/TDAP  11/29/1983  . COLONOSCOPY  11/28/2014    There are no preventive care reminders to display for this patient.  Lab Results  Component Value Date   TSH 0.281 (L) 04/11/2019   Lab Results  Component Value Date   WBC 8.5 04/11/2019   HGB 12.8 (L) 04/11/2019   HCT 40.3 04/11/2019   MCV 87 04/11/2019   PLT 448 04/11/2019   Lab Results  Component Value Date   NA 142 04/11/2019   K 4.2 04/11/2019   CO2 25 04/11/2019   GLUCOSE 82 04/11/2019   BUN 4 (L) 04/11/2019   CREATININE 0.72 (L) 04/11/2019   BILITOT <0.2 04/11/2019   ALKPHOS 54 04/11/2019   AST 13 04/11/2019   ALT 12 04/11/2019   PROT 6.4 04/11/2019   ALBUMIN 3.8 04/11/2019   CALCIUM 9.1  04/11/2019   ANIONGAP 9 03/31/2019   Lab Results  Component Value Date   CHOL 119 04/11/2019   Lab Results  Component Value Date   HDL 33 (L) 04/11/2019   Lab Results  Component Value Date   LDLCALC 61 04/11/2019   Lab Results  Component Value Date   TRIG 125 04/11/2019   Lab Results  Component Value Date   CHOLHDL 3.6 04/11/2019   Lab Results  Component Value Date   HGBA1C 5.8 (  A) 04/11/2019      Assessment & Plan:   1. Hospital discharge follow-up  2. Encounter to establish care  3. Diverticulitis Stable.   4. Screening for diabetes mellitus Hgb A1 is within normal range of 5.8 today. He will continue to decrease foods/beverages high in sugars and carbs and follow Heart Healthy or DASH diet. Increase physical activity to at least 30 minutes cardio exercise daily.  - POCT glycosylated hemoglobin (Hb A1C) - POCT urinalysis dipstick  5. Screening for colon cancer Lengthy discussion on the benefits of CRC screening. Patient will consider. We will send referral to GI today.  - Ambulatory referral to Gastroenterology  6. Healthcare maintenance - PSA - CBC with Differential - Comprehensive metabolic panel - Lipid Panel - TSH - Vitamin D, 25-hydroxy - Vitamin B12  7. Follow up Excuse to return to work given today. He will follow up in 3 months.   No orders of the defined types were placed in this encounter.   Orders Placed This Encounter  Procedures  . PSA  . CBC with Differential  . Comprehensive metabolic panel  . Lipid Panel  . TSH  . Vitamin D, 25-hydroxy  . Vitamin B12  . Ambulatory referral to Gastroenterology  . POCT glycosylated hemoglobin (Hb A1C)  . POCT urinalysis dipstick     Referral Orders     Ambulatory referral to Gastroenterology   Raliegh IpNatalie Jezabel Lecker,  MSN, FNP-BC [facility] [address]  [place], [place] [id number] [phone]  Problem List Items Addressed This Visit      Other    Diverticulitis    Other Visit Diagnoses    Hospital discharge follow-up    -  Primary   Encounter to establish care       Screening for diabetes mellitus       Relevant Orders   POCT glycosylated hemoglobin (Hb A1C) (Completed)   POCT urinalysis dipstick (Completed)   Screening for colon cancer       Relevant Orders   Ambulatory referral to Gastroenterology   Healthcare maintenance       Relevant Orders   PSA (Completed)   CBC with Differential (Completed)   Comprehensive metabolic panel (Completed)   Lipid Panel (Completed)   TSH (Completed)   Vitamin D, 25-hydroxy (Completed)   Vitamin B12 (Completed)   Follow up          No orders of the defined types were placed in this encounter.   Follow-up: Return in about 3 months (around 07/12/2019).    Kallie LocksNatalie M Cuba Natarajan, FNP

## 2019-04-12 LAB — CBC WITH DIFFERENTIAL/PLATELET
Basophils Absolute: 0.1 10*3/uL (ref 0.0–0.2)
Basos: 1 %
EOS (ABSOLUTE): 0.1 10*3/uL (ref 0.0–0.4)
Eos: 1 %
Hematocrit: 40.3 % (ref 37.5–51.0)
Hemoglobin: 12.8 g/dL — ABNORMAL LOW (ref 13.0–17.7)
Immature Grans (Abs): 0 10*3/uL (ref 0.0–0.1)
Immature Granulocytes: 0 %
Lymphocytes Absolute: 2.3 10*3/uL (ref 0.7–3.1)
Lymphs: 27 %
MCH: 27.7 pg (ref 26.6–33.0)
MCHC: 31.8 g/dL (ref 31.5–35.7)
MCV: 87 fL (ref 79–97)
Monocytes Absolute: 0.7 10*3/uL (ref 0.1–0.9)
Monocytes: 9 %
Neutrophils Absolute: 5.3 10*3/uL (ref 1.4–7.0)
Neutrophils: 62 %
Platelets: 448 10*3/uL (ref 150–450)
RBC: 4.62 x10E6/uL (ref 4.14–5.80)
RDW: 12.2 % (ref 11.6–15.4)
WBC: 8.5 10*3/uL (ref 3.4–10.8)

## 2019-04-12 LAB — COMPREHENSIVE METABOLIC PANEL
ALT: 12 IU/L (ref 0–44)
AST: 13 IU/L (ref 0–40)
Albumin/Globulin Ratio: 1.5 (ref 1.2–2.2)
Albumin: 3.8 g/dL (ref 3.8–4.9)
Alkaline Phosphatase: 54 IU/L (ref 39–117)
BUN/Creatinine Ratio: 6 — ABNORMAL LOW (ref 9–20)
BUN: 4 mg/dL — ABNORMAL LOW (ref 6–24)
Bilirubin Total: <0.2 mg/dL (ref 0.0–1.2)
CO2: 25 mmol/L (ref 20–29)
Calcium: 9.1 mg/dL (ref 8.7–10.2)
Chloride: 103 mmol/L (ref 96–106)
Creatinine, Ser: 0.72 mg/dL — ABNORMAL LOW (ref 0.76–1.27)
GFR calc Af Amer: 122 mL/min/{1.73_m2} (ref >59)
GFR calc non Af Amer: 106 mL/min/{1.73_m2} (ref >59)
Globulin, Total: 2.6 g/dL (ref 1.5–4.5)
Glucose: 82 mg/dL (ref 65–99)
Potassium: 4.2 mmol/L (ref 3.5–5.2)
Sodium: 142 mmol/L (ref 134–144)
Total Protein: 6.4 g/dL (ref 6.0–8.5)

## 2019-04-12 LAB — LIPID PANEL
Chol/HDL Ratio: 3.6 ratio (ref 0.0–5.0)
Cholesterol, Total: 119 mg/dL (ref 100–199)
HDL: 33 mg/dL — ABNORMAL LOW (ref >39)
LDL Calculated: 61 mg/dL (ref 0–99)
Triglycerides: 125 mg/dL (ref 0–149)
VLDL Cholesterol Cal: 25 mg/dL (ref 5–40)

## 2019-04-12 LAB — TSH: TSH: 0.281 u[IU]/mL — ABNORMAL LOW (ref 0.450–4.500)

## 2019-04-12 LAB — VITAMIN B12: Vitamin B-12: 461 pg/mL (ref 232–1245)

## 2019-04-12 LAB — PSA: Prostate Specific Ag, Serum: 2.1 ng/mL (ref 0.0–4.0)

## 2019-04-12 LAB — VITAMIN D 25 HYDROXY (VIT D DEFICIENCY, FRACTURES): Vit D, 25-Hydroxy: 14.2 ng/mL — ABNORMAL LOW (ref 30.0–100.0)

## 2019-04-13 ENCOUNTER — Other Ambulatory Visit: Payer: Self-pay | Admitting: Family Medicine

## 2019-04-13 ENCOUNTER — Encounter: Payer: Self-pay | Admitting: Family Medicine

## 2019-04-13 DIAGNOSIS — E039 Hypothyroidism, unspecified: Secondary | ICD-10-CM

## 2019-04-13 DIAGNOSIS — E559 Vitamin D deficiency, unspecified: Secondary | ICD-10-CM

## 2019-04-13 MED ORDER — VITAMIN D (ERGOCALCIFEROL) 1.25 MG (50000 UNIT) PO CAPS: 50000.0000 [IU] | ORAL_CAPSULE | ORAL | 3 refills | Status: AC

## 2019-07-14 ENCOUNTER — Ambulatory Visit: Payer: No Typology Code available for payment source | Admitting: Family Medicine

## 2020-09-07 ENCOUNTER — Other Ambulatory Visit: Payer: Self-pay

## 2020-09-07 ENCOUNTER — Emergency Department (HOSPITAL_COMMUNITY)
Admission: EM | Admit: 2020-09-07 | Discharge: 2020-09-07 | Disposition: A | Discharge status: Home / Self Care | Payer: No Typology Code available for payment source | Attending: Emergency Medicine | Admitting: Emergency Medicine

## 2020-09-07 ENCOUNTER — Encounter (HOSPITAL_COMMUNITY): Payer: Self-pay | Admitting: Emergency Medicine

## 2020-09-07 ENCOUNTER — Emergency Department (HOSPITAL_COMMUNITY): Payer: No Typology Code available for payment source

## 2020-09-07 DIAGNOSIS — T1490XA Injury, unspecified, initial encounter: Secondary | ICD-10-CM

## 2020-09-07 DIAGNOSIS — M7918 Myalgia, other site: Secondary | ICD-10-CM | POA: Diagnosis not present

## 2020-09-07 DIAGNOSIS — Y9355 Activity, bike riding: Secondary | ICD-10-CM | POA: Diagnosis not present

## 2020-09-07 DIAGNOSIS — M25571 Pain in right ankle and joints of right foot: Secondary | ICD-10-CM | POA: Diagnosis not present

## 2020-09-07 DIAGNOSIS — F17210 Nicotine dependence, cigarettes, uncomplicated: Secondary | ICD-10-CM | POA: Insufficient documentation

## 2020-09-07 DIAGNOSIS — Y92410 Unspecified street and highway as the place of occurrence of the external cause: Secondary | ICD-10-CM | POA: Insufficient documentation

## 2020-09-07 DIAGNOSIS — Z7982 Long term (current) use of aspirin: Secondary | ICD-10-CM | POA: Diagnosis not present

## 2020-09-07 DIAGNOSIS — M25561 Pain in right knee: Secondary | ICD-10-CM | POA: Diagnosis not present

## 2020-09-07 MED ORDER — OXYCODONE HCL 5 MG PO TABS
5.0000 mg | ORAL_TABLET | Freq: Once | ORAL | Status: AC
  Administered 2020-09-07: 5 mg via ORAL
  Filled 2020-09-07: qty 1

## 2020-09-07 MED ORDER — METHOCARBAMOL 500 MG PO TABS: 1000.0000 mg | ORAL_TABLET | Freq: Four times a day (QID) | ORAL | 0 refills | Status: AC

## 2020-09-07 MED ORDER — ACETAMINOPHEN 325 MG PO TABS
650.0000 mg | ORAL_TABLET | Freq: Once | ORAL | Status: AC
  Administered 2020-09-07: 650 mg via ORAL
  Filled 2020-09-07: qty 2
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
## 2020-09-26 ENCOUNTER — Emergency Department (HOSPITAL_COMMUNITY): Payer: No Typology Code available for payment source

## 2020-09-26 ENCOUNTER — Encounter (HOSPITAL_COMMUNITY): Payer: Self-pay | Admitting: Emergency Medicine

## 2020-09-26 ENCOUNTER — Emergency Department (HOSPITAL_COMMUNITY)
Admission: EM | Admit: 2020-09-26 | Discharge: 2020-09-26 | Disposition: A | Discharge status: Home / Self Care | Payer: No Typology Code available for payment source | Attending: Emergency Medicine | Admitting: Emergency Medicine

## 2020-09-26 ENCOUNTER — Other Ambulatory Visit: Payer: Self-pay

## 2020-09-26 DIAGNOSIS — Z7982 Long term (current) use of aspirin: Secondary | ICD-10-CM | POA: Insufficient documentation

## 2020-09-26 DIAGNOSIS — M25551 Pain in right hip: Secondary | ICD-10-CM | POA: Insufficient documentation

## 2020-09-26 DIAGNOSIS — Y92410 Unspecified street and highway as the place of occurrence of the external cause: Secondary | ICD-10-CM | POA: Diagnosis not present

## 2020-09-26 DIAGNOSIS — F17210 Nicotine dependence, cigarettes, uncomplicated: Secondary | ICD-10-CM | POA: Diagnosis not present

## 2020-09-26 DIAGNOSIS — M79671 Pain in right foot: Secondary | ICD-10-CM | POA: Diagnosis not present

## 2020-09-26 DIAGNOSIS — M25561 Pain in right knee: Secondary | ICD-10-CM | POA: Diagnosis not present

## 2020-09-26 MED ORDER — DICLOFENAC SODIUM 1 % EX GEL: 2.0000 g | Freq: Four times a day (QID) | CUTANEOUS | 0 refills | Status: AC
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
## 2020-09-26 NOTE — Discharge Instructions (Signed)
Please follow up with orthopedist Dr. Carola Frost for further evaluation of your hip pain I would recommend using the crutches especially if you are having pain with weight baring onto the hip. While at home please rest, ice, and elevate your hip to help reduce inflammation You can take 1,000 mg Tylenol (2 double strength OTC tablets) every 8 hours as needed for pain. I have also prescribed an NSAID gel to apply to this area.   Return to the ED for any worsening symptoms
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
## 2021-02-05 ENCOUNTER — Emergency Department (HOSPITAL_COMMUNITY)
Admission: EM | Admit: 2021-02-05 | Discharge: 2021-02-05 | Discharge status: Absent without leave | Payer: No Typology Code available for payment source

## 2021-02-05 NOTE — ED Provider Notes (Signed)
Patient presented to the ED for work note.  Patient did not actually want to be checked in or be here, charge nurse, Lucretia Roers aware and dismissed patient.  Patient did not have a evaluation.   Farrel Gordon, PA-C 02/05/21 3646    Sabino Donovan, MD 02/06/21 [phone]

## 2021-02-06 ENCOUNTER — Telehealth: Payer: Self-pay | Admitting: Surgery

## 2021-02-06 NOTE — Telephone Encounter (Signed)
ED RNCM received a call from patient concerning work notes he received in the ED a couple months ago, Patient misplaced the notes and need copy for work.  Notes printed and left at front desk for patient to pick up today.

## 2021-06-29 ENCOUNTER — Other Ambulatory Visit: Payer: Self-pay | Admitting: Occupational Medicine

## 2021-06-29 MED ORDER — HYDROCODONE-ACETAMINOPHEN 10-325 MG PO TABS
1.0000 | ORAL_TABLET | Freq: Three times a day (TID) | ORAL | 0 refills | Status: AC | PRN
Start: 2021-06-29 — End: 2021-07-04

## 2021-07-06 ENCOUNTER — Encounter (HOSPITAL_BASED_OUTPATIENT_CLINIC_OR_DEPARTMENT_OTHER): Payer: Worker's Compensation | Attending: Physician Assistant | Admitting: Physician Assistant

## 2021-07-06 ENCOUNTER — Other Ambulatory Visit: Payer: Self-pay

## 2021-07-06 DIAGNOSIS — T25312A Burn of third degree of left ankle, initial encounter: Secondary | ICD-10-CM | POA: Insufficient documentation

## 2021-07-06 DIAGNOSIS — Y990 Civilian activity done for income or pay: Secondary | ICD-10-CM | POA: Insufficient documentation

## 2021-07-06 DIAGNOSIS — F17218 Nicotine dependence, cigarettes, with other nicotine-induced disorders: Secondary | ICD-10-CM | POA: Diagnosis not present

## 2021-07-06 DIAGNOSIS — X18XXXA Contact with other hot metals, initial encounter: Secondary | ICD-10-CM | POA: Diagnosis not present

## 2021-07-06 NOTE — Progress Notes (Addendum)
Grant Mitchell, Grant Mitchell ([id number]) Visit Report for 07/06/2021 Chief Complaint Document Details Patient Name: Date of Service: Grant Mitchell, [place] LD B. 07/06/2021 9:00 A M Medical Record Number: [id number] Patient Account Number: [id number] Date of Birth/Sex: Treating RN: 08/18/1966 (55 y.o. [name] Primary Care Provider: Raliegh Ip Other Clinician: Referring Provider: Treating Provider/Extender: Skeet Simmer in Treatment: 0 Information Obtained from: Patient Chief Complaint 3rd degree burn left ankle Electronic Signature(s) Signed: 07/06/2021 9:59:19 AM By: Lenda Kelp PA-C Entered By: Lenda Kelp on 07/06/2021 09:59:19 -------------------------------------------------------------------------------- HPI Details Patient Name: Date of Service: Grant Savage, [name] 07/06/2021 9:00 A M Medical Record Number: [id number] Patient Account Number: [id number] Date of Birth/Sex: Treating RN: 1966/03/09 (55 y.o. [name] Primary Care Provider: Raliegh Ip Other Clinician: Referring Provider: Treating Provider/Extender: Skeet Simmer in Treatment: 0 History of Present Illness HPI Description: 07/06/2021 upon evaluation today patient presents for initial evaluation here in the clinic concerning a wound that actually occurred due to a significant injury which is work-related. 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 Subsequently this burned through his pants, shoe, sock, and then got to the skin and the incident resolved this happened he was also taken off his shoe. T be perfectly honest I think that he is lucky to still have his foot. Nonetheless I do o believe that this is something that needs to be addressed as far as the wound is concerned he does have a significant burn and I think that this is going require careful  care in order to ensure that it heals appropriately without any additional issues or complications. He voiced understanding of all of the above. He has been taking Percocet for pain. I think something like Celebrex could be beneficial for him try to help with the inflammation and thereby pain as well. The patient does have a history of cigarette smoking although I did advise him he needs to stop and that from a healing perspective this is what is going to help him the most as far as anything he can do for himself. Electronic Signature(s) Signed: 07/06/2021 1:32:08 PM By: Lenda Kelp PA-C Entered By: Lenda Kelp on 07/06/2021 13:32:08 -------------------------------------------------------------------------------- Dressings and/or debridement of burns; small Details Patient Name: Date of Service: SWING, DO [place] LD B. 07/06/2021 9:00 A M Medical Record Number: [id number] Patient Account Number: [id number] Date of Birth/Sex: Treating RN: 1966/03/23 (55 y.o. [name] Primary Care Provider: Raliegh Ip Other Clinician: Referring Provider: Treating Provider/Extender: Skeet Simmer in Treatment: 0 Procedure Performed for: Wound #1 Left,Dorsal Ankle Performed By: Physician Lenda Kelp, PA Post Procedure Diagnosis Same as Pre-procedure Notes dresing Electronic Signature(s) Signed: 07/06/2021 6:00:14 PM By: Lenda Kelp PA-C Signed: 07/06/2021 6:09:03 PM By: Zenaida Deed RN, BSN Entered By: Zenaida Deed on 07/06/2021 10:08:09 -------------------------------------------------------------------------------- Physical Exam Details Patient Name: Date of Service: Grant Savage, [name] 07/06/2021 9:00 A M Medical Record Number: [id number] Patient Account Number: [id number] Date of Birth/Sex: Treating RN: 03-22-1966 (55 y.o. [name] Primary Care Provider: Raliegh Ip Other Clinician: Referring Provider: Treating Provider/Extender: Skeet Simmer in  Treatment: 0 Constitutional Well-nourished and well-hydrated in no acute distress. Eyes conjunctiva clear no eyelid edema noted. pupils equal round and reactive to light and accommodation. Ears, Nose, Mouth, and Throat no gross  abnormality of ear auricles or external auditory canals. normal hearing noted during conversation. mucus membranes moist. Respiratory normal breathing without difficulty. Cardiovascular 2+ dorsalis pedis/posterior tibialis pulses. no clubbing, cyanosis, significant edema, <3 sec cap refill. Musculoskeletal normal gait and posture. no significant deformity or arthritic changes, no loss or range of motion, no clubbing. Psychiatric this patient is able to make decisions and demonstrates good insight into disease process. Alert and Oriented x 3. pleasant and cooperative. Notes Upon inspection patient's wound bed actually showed signs of a third-degree burn although this obviously could have been much more significant than what it was based on what I am seeing currently. I think that he actually is very lucky to still have his foot. With that being said as well I think that this is something that still needs to be managed aggressively to make sure that we get this to heal. The good news is he has good arterial flow which is awesome. Electronic Signature(s) Signed: 07/06/2021 1:32:38 PM By: Lenda Kelp PA-C Entered By: Lenda Kelp on 07/06/2021 13:32:38 -------------------------------------------------------------------------------- Physician Orders Details Patient Name: Date of Service: Grant Savage, [name] 07/06/2021 9:00 A M Medical Record Number: [id number] Patient Account Number: [id number] Date of Birth/Sex: Treating RN: Oct 01, 1966 (55 y.o. [name] Primary Care Provider: Raliegh Ip Other Clinician: Referring Provider: Treating Provider/Extender: Skeet Simmer in Treatment: 0 Verbal / Phone Orders: No Diagnosis Coding ICD-10  Coding Code Description T25.312A Burn of third degree of left ankle, initial encounter F17.218 Nicotine dependence, cigarettes, with other nicotine-induced disorders Follow-up Appointments Return Appointment in 1 week. Bathing/ Shower/ Hygiene May shower and wash wound with soap and water. - use dial antibacterial soap Wound Treatment Wound #1 - Ankle Wound Laterality: Dorsal, Left Topical: Silvadene Cream 1 x Per Day/30 Days Discharge Instructions: Apply thin layer to wound bed only Prim Dressing: Xeroform Occlusive Gauze Dressing, 4x4 in 1 x Per Day/30 Days ary Discharge Instructions: Apply to wound bed as instructed Secondary Dressing: Bordered Gauze, 4x4 in 1 x Per Day/30 Days Discharge Instructions: Apply over primary dressing as directed. Patient Medications llergies: penicillin, ibuprofen A Notifications Medication Indication Start End 07/06/2021 Celebrex DOSE 1 - oral 200 mg capsule - 1 capsule oral taken 2 times per day for 30 days. Do not take ibuprofen with this medication 07/08/2021 Silvadene DOSE topical 1 % cream - cream topical applied liberally to the burn location then cover with your dressing daily as directed in the clinic Electronic Signature(s) Signed: 07/08/2021 1:59:31 PM By: Lenda Kelp PA-C Previous Signature: 07/06/2021 10:23:27 AM Version By: Lenda Kelp PA-C Entered By: Lenda Kelp on 07/08/2021 13:59:31 -------------------------------------------------------------------------------- Problem List Details Patient Name: Date of Service: Grant Savage, [name] 07/06/2021 9:00 A M Medical Record Number: [id number] Patient Account Number: [id number] Date of Birth/Sex: Treating RN: Jun 09, 1966 (56 y.o. [name] Primary Care Provider: Raliegh Ip Other Clinician: Referring Provider: Treating Provider/Extender: Skeet Simmer in Treatment: 0 Active Problems ICD-10 Encounter Code Description Active Date MDM Diagnosis T25.312A Burn  of third degree of left ankle, initial encounter 07/06/2021 No Yes F17.218 Nicotine dependence, cigarettes, with other nicotine-induced disorders 07/06/2021 No Yes Inactive Problems Resolved Problems Electronic Signature(s) Signed: 07/06/2021 9:58:51 AM By: Lenda Kelp PA-C Entered By: Lenda Kelp on 07/06/2021 09:58:51 -------------------------------------------------------------------------------- Progress Note Details Patient Name: Date of Service: Grant Savage, [name] 07/06/2021 9:00 A M Medical Record Number: [id number] Patient Account Number: [id number] Date of Birth/Sex: Treating RN:  12-Dec-1965 (55 y.o. Damaris SchoonerM) Boehlein, Linda Primary Care Provider: Raliegh IpStroud, Natalie Other Clinician: Referring Provider: Treating Provider/Extender: Skeet SimmerStone III, Zachary Nole Weeks in Treatment: 0 Subjective Chief Complaint Information obtained from Patient 3rd degree burn left ankle History of Present Illness (HPI) 07/06/2021 upon evaluation today patient presents for initial evaluation here in the clinic concerning a wound that actually occurred due to a significant injury which is work-related. 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 Subsequently this burned through his pants, shoe, sock, and then got to the skin and the incident resolved this happened he was also taken off his shoe. T be perfectly honest I think that he is lucky to still have his foot. Nonetheless I do believe that this o is something that needs to be addressed as far as the wound is concerned he does have a significant burn and I think that this is going require careful care in order to ensure that it heals appropriately without any additional issues or complications. He voiced understanding of all of the above. He has been taking Percocet for pain. I think something like Celebrex could be beneficial  for him try to help with the inflammation and thereby pain as well. The patient does have a history of cigarette smoking although I did advise him he needs to stop and that from a healing perspective this is what is going to help him the most as far as anything he can do for himself. Patient History Information obtained from Patient, Chart. Allergies penicillin (Severity: Mild, Reaction: unsure; was young went to hospital as a child), ibuprofen (Severity: Mild, Reaction: stomach pain) Family History Diabetes - Mother, Heart Disease - Father, Hypertension - Father, No family history of Cancer, Hereditary Spherocytosis, Kidney Disease, Lung Disease, Seizures, Stroke, Thyroid Problems, Tuberculosis. Social History Current every day smoker - 1 ppd, Marital Status - Single, Alcohol Use - Never, Drug Use - No History, Caffeine Use - Daily - coffee. Medical History Eyes Denies history of Cataracts, Glaucoma, Optic Neuritis Ear/Nose/Mouth/Throat Denies history of Chronic sinus problems/congestion, Middle ear problems Hematologic/Lymphatic Denies history of Anemia, Hemophilia, Human Immunodeficiency Virus, Lymphedema, Sickle Cell Disease Respiratory Denies history of Aspiration, Asthma, Chronic Obstructive Pulmonary Disease (COPD), Pneumothorax, Sleep Apnea, Tuberculosis Cardiovascular Denies history of Angina, Arrhythmia, Congestive Heart Failure, Coronary Artery Disease, Deep Vein Thrombosis, Hypertension, Hypotension, Myocardial Infarction, Peripheral Arterial Disease, Peripheral Venous Disease, Phlebitis, Vasculitis Endocrine Denies history of Type I Diabetes, Type II Diabetes Genitourinary Denies history of End Stage Renal Disease Immunological Denies history of Lupus Erythematosus, Raynaudoos, Scleroderma Integumentary (Skin) Patient has history of History of Burn - currently Musculoskeletal Denies history of Gout, Rheumatoid Arthritis, Osteoarthritis, Osteomyelitis Neurologic Denies  history of Dementia, Neuropathy, Quadriplegia, Paraplegia, Seizure Disorder Oncologic Denies history of Received Chemotherapy, Received Radiation Psychiatric Denies history of Anorexia/bulimia, Confinement Anxiety Medical A Surgical History Notes nd Constitutional Symptoms (General Health) hypothyroidism Review of Systems (ROS) Constitutional Symptoms (General Health) Denies complaints or symptoms of Fatigue, Fever, Chills, Marked Weight Change. Eyes Denies complaints or symptoms of Dry Eyes, Vision Changes, Glasses / Contacts. Ear/Nose/Mouth/Throat Denies complaints or symptoms of Chronic sinus problems or rhinitis. Respiratory Denies complaints or symptoms of Chronic or frequent coughs, Shortness of Breath. Cardiovascular Denies complaints or symptoms of Chest pain. Endocrine Denies complaints or symptoms of Heat/cold intolerance. Genitourinary Denies complaints or symptoms of Frequent urination. Integumentary (Skin) Complains or has  symptoms of Wounds - left dorsal ankle. Musculoskeletal Denies complaints or symptoms of Muscle Pain, Muscle Weakness. Neurologic Denies complaints or symptoms of Numbness/parasthesias. Psychiatric Denies complaints or symptoms of Claustrophobia, Suicidal. Objective Constitutional Well-nourished and well-hydrated in no acute distress. Vitals Time Taken: 9:10 AM, Height: 68 in, Source: Stated, Weight: 142 lbs, Source: Stated, BMI: 21.6. Eyes conjunctiva clear no eyelid edema noted. pupils equal round and reactive to light and accommodation. Ears, Nose, Mouth, and Throat no gross abnormality of ear auricles or external auditory canals. normal hearing noted during conversation. mucus membranes moist. Respiratory normal breathing without difficulty. Cardiovascular 2+ dorsalis pedis/posterior tibialis pulses. no clubbing, cyanosis, significant edema, Musculoskeletal normal gait and posture. no significant deformity or arthritic changes, no loss  or range of motion, no clubbing. Psychiatric this patient is able to make decisions and demonstrates good insight into disease process. Alert and Oriented x 3. pleasant and cooperative. General Notes: Upon inspection patient's wound bed actually showed signs of a third-degree burn although this obviously could have been much more significant than what it was based on what I am seeing currently. I think that he actually is very lucky to still have his foot. With that being said as well I think that this is something that still needs to be managed aggressively to make sure that we get this to heal. The good news is he has good arterial flow which is awesome. Integumentary (Hair, Skin) Wound #1 status is Open. Original cause of wound was Thermal Burn. The date acquired was: 06/22/2021. The wound is located on the Left,Dorsal Ankle. The wound measures 5.5cm length x 4cm width x 0.1cm depth; 17.279cm^2 area and 1.728cm^3 volume. There is Fat Layer (Subcutaneous Tissue) exposed. There is no tunneling or undermining noted. There is a medium amount of serosanguineous drainage noted. The wound margin is distinct with the outline attached to the wound base. There is medium (34-66%) red, pink granulation within the wound bed. There is a medium (34-66%) amount of necrotic tissue within the wound bed including Adherent Slough. Assessment Active Problems ICD-10 Burn of third degree of left ankle, initial encounter Nicotine dependence, cigarettes, with other nicotine-induced disorders Procedures Wound #1 Pre-procedure diagnosis of Wound #1 is a 3rd degree Burn located on the Left,Dorsal Ankle . An Dressings and/or debridement of burns; small procedure was performed by Lenda Kelp, PA. Post procedure Diagnosis Wound #1: Same as Pre-Procedure Notes: dresing Plan Follow-up Appointments: Return Appointment in 1 week. Bathing/ Shower/ Hygiene: May shower and wash wound with soap and water. - use dial  antibacterial soap The following medication(s) was prescribed: Celebrex oral 200 mg capsule 1 1 capsule oral taken 2 times per day for 30 days. Do not take ibuprofen with this medication starting 07/06/2021 Silvadene topical 1 % cream cream topical applied liberally to the burn location then cover with your dressing daily as directed in the clinic starting 07/08/2021 WOUND #1: - Ankle Wound Laterality: Dorsal, Left Topical: Silvadene Cream 1 x Per Day/30 Days Discharge Instructions: Apply thin layer to wound bed only Prim Dressing: Xeroform Occlusive Gauze Dressing, 4x4 in 1 x Per Day/30 Days ary Discharge Instructions: Apply to wound bed as instructed Secondary Dressing: Bordered Gauze, 4x4 in 1 x Per Day/30 Days Discharge Instructions: Apply over primary dressing as directed. 1. Would recommend currently that we initiate a continuation of treatment with Silvadene cream followed by Xeroform gauze to try to help prevent this from drying out I think that part of his pain is the fact  that he is noting that the gauze gets really dry and stuck to the wound and he has to peel it off. 2. I am also can recommend that we going to put him on Celebrex try to help with discomfort. With that being said he may want a referral to pain management although I think this will be short-term nonetheless I do believe that that is appropriate if that is the direction he wants to go although I would recommend trying the Celebrex first if at all possible and see if that coupled with tissues in his left over Percocet at night for if he really needs it might be the better way to go especially since this is definitely inflammatory in nature as far as the burn is concerned. 3. I am also can recommend the patient continue to monitor for any signs of infection that wants anything right now that could definitely change we will keep a close eye on this. We will see patient back for reevaluation in 1 week here in the clinic. If  anything worsens or changes patient will contact our office for additional recommendations. Electronic Signature(s) Signed: 07/08/2021 2:00:03 PM By: Lenda Kelp PA-C Previous Signature: 07/06/2021 1:34:01 PM Version By: Lenda Kelp PA-C Entered By: Lenda Kelp on 07/08/2021 14:00:03 -------------------------------------------------------------------------------- HxROS Details Patient Name: Date of Service: Grant Savage, [name] 07/06/2021 9:00 A M Medical Record Number: [id number] Patient Account Number: [id number] Date of Birth/Sex: Treating RN: Jul 14, 1966 (55 y.o. Tammy Sours Primary Care Provider: Raliegh Ip Other Clinician: Referring Provider: Treating Provider/Extender: Skeet Simmer in Treatment: 0 Information Obtained From Patient Chart Constitutional Symptoms (General Health) Complaints and Symptoms: Negative for: Fatigue; Fever; Chills; Marked Weight Change Medical History: Past Medical History Notes: hypothyroidism Eyes Complaints and Symptoms: Negative for: Dry Eyes; Vision Changes; Glasses / Contacts Medical History: Negative for: Cataracts; Glaucoma; Optic Neuritis Ear/Nose/Mouth/Throat Complaints and Symptoms: Negative for: Chronic sinus problems or rhinitis Medical History: Negative for: Chronic sinus problems/congestion; Middle ear problems Respiratory Complaints and Symptoms: Negative for: Chronic or frequent coughs; Shortness of Breath Medical History: Negative for: Aspiration; Asthma; Chronic Obstructive Pulmonary Disease (COPD); Pneumothorax; Sleep Apnea; Tuberculosis Cardiovascular Complaints and Symptoms: Negative for: Chest pain Medical History: Negative for: Angina; Arrhythmia; Congestive Heart Failure; Coronary Artery Disease; Deep Vein Thrombosis; Hypertension; Hypotension; Myocardial Infarction; Peripheral Arterial Disease; Peripheral Venous Disease; Phlebitis; Vasculitis Endocrine Complaints and Symptoms: Negative for:  Heat/cold intolerance Medical History: Negative for: Type I Diabetes; Type II Diabetes Genitourinary Complaints and Symptoms: Negative for: Frequent urination Medical History: Negative for: End Stage Renal Disease Integumentary (Skin) Complaints and Symptoms: Positive for: Wounds - left dorsal ankle Medical History: Positive for: History of Burn - currently Musculoskeletal Complaints and Symptoms: Negative for: Muscle Pain; Muscle Weakness Medical History: Negative for: Gout; Rheumatoid Arthritis; Osteoarthritis; Osteomyelitis Neurologic Complaints and Symptoms: Negative for: Numbness/parasthesias Medical History: Negative for: Dementia; Neuropathy; Quadriplegia; Paraplegia; Seizure Disorder Psychiatric Complaints and Symptoms: Negative for: Claustrophobia; Suicidal Medical History: Negative for: Anorexia/bulimia; Confinement Anxiety Hematologic/Lymphatic Medical History: Negative for: Anemia; Hemophilia; Human Immunodeficiency Virus; Lymphedema; Sickle Cell Disease Immunological Medical History: Negative for: Lupus Erythematosus; Raynauds; Scleroderma Oncologic Medical History: Negative for: Received Chemotherapy; Received Radiation Immunizations Pneumococcal Vaccine: Received Pneumococcal Vaccination: No Implantable Devices None Family and Social History Cancer: No; Diabetes: Yes - Mother; Heart Disease: Yes - Father; Hereditary Spherocytosis: No; Hypertension: Yes - Father; Kidney Disease: No; Lung Disease: No; Seizures: No; Stroke: No; Thyroid Problems: No; Tuberculosis: No; Current every day smoker - 1  ppd; Marital Status - Single; Alcohol Use: Never; Drug Use: No History; Caffeine Use: Daily - coffee; Financial Concerns: No; Food, Clothing or Shelter Needs: No; Support System Lacking: No; Transportation Concerns: No Electronic Signature(s) Signed: 07/06/2021 5:29:03 PM By: Shawn Stall Signed: 07/06/2021 6:00:14 PM By: Lenda Kelp PA-C Entered By: Shawn Stall on 07/06/2021 09:21:54 -------------------------------------------------------------------------------- SuperBill Details Patient Name: Date of Service: Grant Savage, [name] 07/06/2021 Medical Record Number: [id number] Patient Account Number: [id number] Date of Birth/Sex: Treating RN: [birth year] (55 y.o. [name] Primary Care Provider: Raliegh Ip Other Clinician: Referring Provider: Treating Provider/Extender: Skeet Simmer in Treatment: 0 Diagnosis Coding ICD-10 Codes Code Description T25.312A Burn of third degree of left ankle, initial encounter F17.218 Nicotine dependence, cigarettes, with other nicotine-induced disorders Facility Procedures CPT4 Code: [id number] Description: 9214 - WOUND CARE VISIT-LEV 4 EST PT 25 Modifier: 1 Quantity: CPT4 Code: [id number] 1 Description: 6020 - BURN DRSG W/O ANESTH-SM ICD-10 Diagnosis Description T25.312A Burn of third degree of left ankle, initial encounter Modifier: 1 Quantity: Physician Procedures : CPT4 Code Description Modifier [id number] 99214 - WC PHYS LEVEL 4 - EST PT 25 ICD-10 Diagnosis Description T25.312A Burn of third degree of left ankle, initial encounter F17.218 Nicotine dependence, cigarettes, with other nicotine-induced disorders Quantity: 1 : [id number] 16020 - WC PHYS DRESS/DEBRID SM,<5% TOT BODY SURF ICD-10 Diagnosis Description T25.312A Burn of third degree of left ankle, initial encounter Quantity: 1 Electronic Signature(s) Signed: 07/06/2021 6:00:14 PM By: Lenda Kelp PA-C Signed: 07/06/2021 6:09:03 PM By: Zenaida Deed RN, BSN Previous Signature: 07/06/2021 1:34:17 PM Version By: Lenda Kelp PA-C Entered By: Zenaida Deed on 07/06/2021 17:32:45

## 2021-07-06 NOTE — Progress Notes (Signed)
DEWEY, VIENS ([id number]) Visit Report for 07/06/2021 Abuse/Suicide Risk Screen Details Patient Name: Date of Service: Grant Mitchell, Grant [place] Mitchell B. 07/06/2021 9:00 A M Medical Record Number: [id number] Patient Account Number: [id number] Date of Birth/Sex: Treating RN: [birth year] (55 y.o. Tammy Sours Primary Care Kholton Coate: Raliegh Ip Other Clinician: Referring Tyjon Bowen: Treating Stormi Vandevelde/Extender: Skeet Simmer in Treatment: 0 Abuse/Suicide Risk Screen Items Answer ABUSE RISK SCREEN: Has anyone close to you tried to hurt or harm you recentlyo No Do you feel uncomfortable with anyone in your familyo No Has anyone forced you do things that you didnt want to doo No Electronic Signature(s) Signed: 07/06/2021 5:29:03 PM By: Shawn Stall Entered By: Shawn Stall on 07/06/2021 09:15:00 -------------------------------------------------------------------------------- Activities of Daily Living Details Patient Name: Date of Service: Grant Mitchell B. 07/06/2021 9:00 A M Medical Record Number: [id number] Patient Account Number: [id number] Date of Birth/Sex: Treating RN: 1966/10/04 (55 y.o. Tammy Sours Primary Care Nyzier Boivin: Raliegh Ip Other Clinician: Referring Jones Viviani: Treating Dorthey Depace/Extender: Skeet Simmer in Treatment: 0 Activities of Daily Living Items Answer Activities of Daily Living (Please select one for each item) Drive Automobile Completely Able T Medications ake Completely Able Use T elephone Completely Able Care for Appearance Completely Able Use T oilet Completely Able Bath / Shower Completely Able Dress Self Completely Able Feed Self Completely Able Walk Need Assistance Get In / Out Bed Completely Able Housework Completely Able Prepare Meals Completely Able Handle Money Completely Able Shop for Self Completely Able Electronic Signature(s) Signed: 07/06/2021 5:29:03 PM By: Shawn Stall Entered By: Shawn Stall on 07/06/2021  09:15:29 -------------------------------------------------------------------------------- Education Screening Details Patient Name: Date of Service: Grant Savage, DO NA Mitchell B. 07/06/2021 9:00 A M Medical Record Number: [id number] Patient Account Number: [id number] Date of Birth/Sex: Treating RN: 11-13-1965 (55 y.o. Tammy Sours Primary Care Maicy Filip: Raliegh Ip Other Clinician: Referring Latrecia Capito: Treating Clothilde Tippetts/Extender: Skeet Simmer in Treatment: 0 Primary Learner Assessed: Patient Learning Preferences/Education Level/Primary Language Learning Preference: Explanation, Demonstration, Printed Material Highest Education Level: College or Above Preferred Language: English Cognitive Barrier Language Barrier: No Translator Needed: No Memory Deficit: No Emotional Barrier: No Cultural/Religious Beliefs Affecting Medical Care: No Physical Barrier Impaired Vision: No Impaired Hearing: No Decreased Hand dexterity: No Knowledge/Comprehension Knowledge Level: High Comprehension Level: High Ability to understand written instructions: High Ability to understand verbal instructions: High Motivation Anxiety Level: Calm Cooperation: Cooperative Education Importance: Acknowledges Need Interest in Health Problems: Asks Questions Perception: Coherent Willingness to Engage in Self-Management High Activities: Readiness to Engage in Self-Management High Activities: Electronic Signature(s) Signed: 07/06/2021 5:29:03 PM By: Shawn Stall Entered By: Shawn Stall on 07/06/2021 09:18:05 -------------------------------------------------------------------------------- Fall Risk Assessment Details Patient Name: Date of Service: Grant Savage, DO NA Mitchell B. 07/06/2021 9:00 A M Medical Record Number: [id number] Patient Account Number: [id number] Date of Birth/Sex: Treating RN: 1966-07-23 (55 y.o. Tammy Sours Primary Care Altheria Shadoan: Raliegh Ip Other Clinician: Referring  Milagro Belmares: Treating Kynzie Polgar/Extender: Skeet Simmer in Treatment: 0 Fall Risk Assessment Items Have you had 2 or more falls in the last 12 monthso 0 No Have you had any fall that resulted in injury in the last 12 monthso 0 No FALLS RISK SCREEN History of falling - immediate or within 3 months 0 No Secondary diagnosis (Do you have 2 or more medical diagnoseso) 0 No Ambulatory aid None/bed rest/wheelchair/nurse 0 Yes Crutches/cane/walker 0 No Furniture 0 No Intravenous therapy Access/Saline/Heparin Lock 0 No Gait/Transferring Normal/ bed rest/ wheelchair 0 Yes Weak (short steps with or  without shuffle, stooped but able to lift head while walking, may seek 0 No support from furniture) Impaired (short steps with shuffle, may have difficulty arising from chair, head down, impaired 0 No balance) Mental Status Oriented to own ability 0 Yes Electronic Signature(s) Signed: 07/06/2021 5:29:03 PM By: Shawn Stall Entered By: Shawn Stall on 07/06/2021 09:18:27 -------------------------------------------------------------------------------- Foot Assessment Details Patient Name: Date of Service: Grant Savage, DO NA Mitchell B. 07/06/2021 9:00 A M Medical Record Number: [id number] Patient Account Number: [id number] Date of Birth/Sex: Treating RN: 01-Mar-1966 (55 y.o. Tammy Sours Primary Care Albina Gosney: Raliegh Ip Other Clinician: Referring Nayana Lenig: Treating Kodi Guerrera/Extender: Skeet Simmer in Treatment: 0 Foot Assessment Items Site Locations + = Sensation present, - = Sensation absent, C = Callus, U = Ulcer R = Redness, W = Warmth, M = Maceration, PU = Pre-ulcerative lesion F = Fissure, S = Swelling, D = Dryness Assessment Right: Left: Other Deformity: No No Prior Foot Ulcer: No No Prior Amputation: No No Charcot Joint: No No Ambulatory Status: Ambulatory With Help Assistance Device: Wheelchair Gait: Steady Notes per patient hurts to ambulate. Electronic  Signature(s) Signed: 07/06/2021 5:29:03 PM By: Shawn Stall Entered By: Shawn Stall on 07/06/2021 09:27:52 -------------------------------------------------------------------------------- Nutrition Risk Screening Details Patient Name: Date of Service: Grant Mitchell B. 07/06/2021 9:00 A M Medical Record Number: [id number] Patient Account Number: [id number] Date of Birth/Sex: Treating RN: 1965-11-26 (55 y.o. Tammy Sours Primary Care Allie Ousley: Raliegh Ip Other Clinician: Referring Daymion Nazaire: Treating Aracelly Tencza/Extender: Skeet Simmer in Treatment: 0 Height (in): 68 Weight (lbs): 142 Body Mass Index (BMI): 21.6 Nutrition Risk Screening Items Score Screening NUTRITION RISK SCREEN: I have an illness or condition that made me change the kind and/or amount of food I eat 0 No I eat fewer than two meals per day 0 No I eat few fruits and vegetables, or milk products 0 No I have three or more drinks of beer, liquor or wine almost every day 0 No I have tooth or mouth problems that make it hard for me to eat 0 No I don't always have enough money to buy the food I need 0 No I eat alone most of the time 0 No I take three or more different prescribed or over-the-counter drugs a day 0 No Without wanting to, I have lost or gained 10 pounds in the last six months 0 No I am not always physically able to shop, cook and/or feed myself 0 No Nutrition Protocols Good Risk Protocol 0 No interventions needed Moderate Risk Protocol High Risk Proctocol Risk Level: Good Risk Score: 0 Electronic Signature(s) Signed: 07/06/2021 5:29:03 PM By: Shawn Stall Entered By: Shawn Stall on 07/06/2021 09:18:36
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
## 2021-07-13 ENCOUNTER — Other Ambulatory Visit: Payer: Self-pay

## 2021-07-13 ENCOUNTER — Encounter (HOSPITAL_BASED_OUTPATIENT_CLINIC_OR_DEPARTMENT_OTHER): Payer: Worker's Compensation | Admitting: Physician Assistant

## 2021-07-13 DIAGNOSIS — T25312A Burn of third degree of left ankle, initial encounter: Secondary | ICD-10-CM | POA: Diagnosis not present

## 2021-07-13 NOTE — Progress Notes (Addendum)
Grant Mitchell, Grant Mitchell ([id number]) Visit Report for 07/13/2021 Chief Complaint Document Details Patient Name: Date of Service: [place], [place] LD B. 07/13/2021 9:30 A M Medical Record Number: [id number] Patient Account Number: [id number] Date of Birth/Sex: Treating RN: 05/14/1966 (55 y.o. Grant Mitchell Primary Care Provider: Raliegh Ip Other Clinician: Referring Provider: Treating Provider/Extender: Skeet Simmer in Treatment: 1 Information Obtained from: Patient Chief Complaint 3rd degree burn left ankle Electronic Signature(s) Signed: 07/13/2021 9:55:28 AM By: Lenda Kelp PA-C Previous Signature: 07/13/2021 9:55:14 AM Version By: Lenda Kelp PA-C Entered By: Lenda Kelp on 07/13/2021 09:55:27 -------------------------------------------------------------------------------- HPI Details Patient Name: Date of Service: Grant Savage, [name] 07/13/2021 9:30 A M Medical Record Number: [id number] Patient Account Number: [id number] Date of Birth/Sex: Treating RN: 1966/06/28 (55 y.o. Grant Mitchell Primary Care Provider: Raliegh Ip Other Clinician: Referring Provider: Treating Provider/Extender: Skeet Simmer in Treatment: 1 History of Present Illness HPI Description: 07/06/2021 upon evaluation today patient presents for initial evaluation here in the clinic concerning a wound that actually occurred due to a significant injury which is work-related. 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 Subsequently this burned through his pants, shoe, sock, and then got to the skin and the incident resolved this happened he was also taken off his shoe. T be perfectly honest I think that he is lucky to still have his foot. Nonetheless I do o believe that this is something that needs to be addressed as far as the wound is concerned  he does have a significant burn and I think that this is going require careful care in order to ensure that it heals appropriately without any additional issues or complications. He voiced understanding of all of the above. He has been taking Percocet for pain. I think something like Celebrex could be beneficial for him try to help with the inflammation and thereby pain as well. The patient does have a history of cigarette smoking although I did advise him he needs to stop and that from a healing perspective this is what is going to help him the most as far as anything he can do for himself. 07/13/2021 upon evaluation today patient's wound is actually showing signs of excellent improvement which is great news. Again this still is a significant issue where I think he probably sustained a greater degree of nerve damage due to the thermal burn during the initial injury. He still having significant pain which is actually disproportionate to the overall wound opening but again with a thermal burn as such I definitely can see this being an issue. Nonetheless I think as of today compared to last week he has about 90% healed. I fully expect him to probably be healed in the next 2 weeks possibly even by next week. Nonetheless even if he is closed next week I probably follow him for 1 more just to make sure that there is nothing that reopens on that the skin toughen's up before discharge from my perspective and wound care. Nonetheless I feel like he may benefit based on the type of pain he is having and the severity of the pain from something such as Lyrica or gabapentin. T that end I did speak with the case manager and subsequently I am going to see about going ahead and make o an referral to Dr.  Ramo's who is a local physiatrist to see if he can help out in this regard potentially. I do believe Cymbalta could even be of benefit for him. Electronic Signature(s) Signed: 07/13/2021 10:17:37 AM By: Lenda Kelp  PA-C Entered By: Lenda Kelp on 07/13/2021 10:17:35 -------------------------------------------------------------------------------- Dressings and/or debridement of burns; small Details Patient Name: Date of Service: [name], [place] LD B. 07/13/2021 9:30 A M Medical Record Number: [id number] Patient Account Number: [id number] Date of Birth/Sex: Treating RN: 09/20/1966 (55 y.o. Grant Mitchell Primary Care Provider: Raliegh Ip Other Clinician: Referring Provider: Treating Provider/Extender: Skeet Simmer in Treatment: 1 Procedure Performed for: Wound #1 Left,Dorsal Ankle Performed By: Physician Lenda Kelp, PA Post Procedure Diagnosis Same as Pre-procedure Notes silvadene and xeroform Electronic Signature(s) Signed: 07/13/2021 5:28:57 PM By: Lenda Kelp PA-C Signed: 07/13/2021 6:35:12 PM By: Zenaida Deed RN, BSN Entered By: Zenaida Deed on 07/13/2021 10:09:31 -------------------------------------------------------------------------------- Physical Exam Details Patient Name: Date of Service: Grant Savage, [name] 07/13/2021 9:30 A M Medical Record Number: [id number] Patient Account Number: [id number] Date of Birth/Sex: Treating RN: Jan 29, 1966 (55 y.o. Grant Mitchell Primary Care Provider: Raliegh Ip Other Clinician: Referring Provider: Treating Provider/Extender: Skeet Simmer in Treatment: 1 Constitutional Well-nourished and well-hydrated in no acute distress. Respiratory normal breathing without difficulty. Psychiatric this patient is able to make decisions and demonstrates good insight into disease process. Alert and Oriented x 3. pleasant and cooperative. Notes Upon inspection patient's wound again showed signs of about 90% epithelial growth and this is significantly improved with just 1 week's time. I am actually very pleased. Unfortunately his pain level has not diminished to an equal degree which is a little bit more unfortunate. Has  not been able to drive his motorcycle which is his only mode of transportation at this point due to the severity of the pain he tells me even standing long periods of time is extremely excruciating. Nonetheless again this is outside of the realm of what I can manage and handle and wound care I think he could benefit from potentially Lyrica which would be a good option possibly even Cymbalta if necessary but honestly I think gabapentin or Lyrica would be the initial steps to take in this case. For that reason I am to see about making referral today. Electronic Signature(s) Signed: 07/13/2021 10:18:22 AM By: Lenda Kelp PA-C Entered By: Lenda Kelp on 07/13/2021 10:18:22 -------------------------------------------------------------------------------- Physician Orders Details Patient Name: Date of Service: Grant Savage, [name] 07/13/2021 9:30 A M Medical Record Number: [id number] Patient Account Number: [id number] Date of Birth/Sex: Treating RN: Jun 21, 1966 (55 y.o. Grant Mitchell Primary Care Provider: Raliegh Ip Other Clinician: Referring Provider: Treating Provider/Extender: Skeet Simmer in Treatment: 1 Verbal / Phone Orders: No Diagnosis Coding ICD-10 Coding Code Description T25.312A Burn of third degree of left ankle, initial encounter F17.218 Nicotine dependence, cigarettes, with other nicotine-induced disorders Follow-up Appointments Return Appointment in 1 week. Bathing/ Shower/ Hygiene May shower and wash wound with soap and water. - use dial antibacterial soap Wound Treatment Wound #1 - Ankle Wound Laterality: Dorsal, Left Topical: Silvadene Cream 1 x Per Day/30 Days Discharge Instructions: Apply thin layer to wound bed only Prim Dressing: Xeroform Occlusive Gauze Dressing, 4x4 in 1 x Per Day/30 Days ary Discharge Instructions: Apply to wound bed as instructed Secondary Dressing: Bordered Gauze, 4x4 in 1 x Per Day/30 Days Discharge Instructions: Apply  over primary dressing as directed. Consults Pain Clinic - Dr. Ethelene Hal  at [facility] due to nerve pain secondary to 3rd degree burn left lower leg - (ICD10 T25.312A - Burn of third degree of left ankle, initial encounter) Electronic Signature(s) Signed: 07/13/2021 5:28:57 PM By: Lenda Kelp PA-C Signed: 07/13/2021 6:35:12 PM By: Zenaida Deed RN, BSN Entered By: Zenaida Deed on 07/13/2021 10:15:14 Prescription 07/13/2021 -------------------------------------------------------------------------------- Lawerance Cruel B. Lenda Kelp [place] Patient Name: Provider: 1966/05/16 [id number] Date of Birth: NPI#: Judie Petit [id number] Sex: DEA #: [phone] Phone #: License #: Eligha Bridegroom [facility] Patient Address: [address] [place], [place] [address] [address] [place], [place] [address] [phone] Allergies penicillin; ibuprofen Provider's Orders Pain Clinic - ICD10: T25.312A - Dr. Ethelene Hal at [facility] due to nerve pain secondary to 3rd degree burn left lower leg Hand Signature: Date(s): Electronic Signature(s) Signed: 07/13/2021 5:28:57 PM By: Lenda Kelp PA-C Signed: 07/13/2021 6:35:12 PM By: Zenaida Deed RN, BSN Entered By: Zenaida Deed on 07/13/2021 10:15:15 -------------------------------------------------------------------------------- Problem List Details Patient Name: Date of Service: Grant Savage, [name] 07/13/2021 9:30 A M Medical Record Number: [id number] Patient Account Number: [id number] Date of Birth/Sex: Treating RN: 02-17-1966 (55 y.o. Grant Mitchell Primary Care Provider: Raliegh Ip Other Clinician: Referring Provider: Treating Provider/Extender: Skeet Simmer in Treatment: 1 Active Problems ICD-10 Encounter Code Description Active Date MDM Diagnosis T25.312A Burn of third degree of left ankle, initial encounter 07/06/2021 No Yes F17.218 Nicotine dependence, cigarettes, with other  nicotine-induced disorders 07/06/2021 No Yes Inactive Problems Resolved Problems Electronic Signature(s) Signed: 07/13/2021 9:55:06 AM By: Lenda Kelp PA-C Entered By: Lenda Kelp on 07/13/2021 09:55:06 -------------------------------------------------------------------------------- Progress Note Details Patient Name: Date of Service: Grant Savage, [name] 07/13/2021 9:30 A M Medical Record Number: [id number] Patient Account Number: [id number] Date of Birth/Sex: Treating RN: 09/10/1966 (55 y.o. Grant Mitchell Primary Care Provider: Raliegh Ip Other Clinician: Referring Provider: Treating Provider/Extender: Skeet Simmer in Treatment: 1 Subjective Chief Complaint Information obtained from Patient 3rd degree burn left ankle History of Present Illness (HPI) 07/06/2021 upon evaluation today patient presents for initial evaluation here in the clinic concerning a wound that actually occurred due to a significant injury which is work-related. 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 Subsequently this burned through his pants, shoe, sock, and then got to the skin and the incident resolved this happened he was also taken off his shoe. T be perfectly honest I think that he is lucky to still have his foot. Nonetheless I do believe that this o is something that needs to be addressed as far as the wound is concerned he does have a significant burn and I think that this is going require careful care in order to ensure that it heals appropriately without any additional issues or complications. He voiced understanding of all of the above. He has been taking Percocet for pain. I think something like Celebrex could be beneficial for him try to help with the inflammation and thereby pain as well. The patient does have a history of cigarette  smoking although I did advise him he needs to stop and that from a healing perspective this is what is going to help him the most as far as anything he can do for himself. 07/13/2021 upon evaluation today patient's wound is actually showing signs of excellent improvement  which is great news. Again this still is a significant issue where I think he probably sustained a greater degree of nerve damage due to the thermal burn during the initial injury. He still having significant pain which is actually disproportionate to the overall wound opening but again with a thermal burn as such I definitely can see this being an issue. Nonetheless I think as of today compared to last week he has about 90% healed. I fully expect him to probably be healed in the next 2 weeks possibly even by next week. Nonetheless even if he is closed next week I probably follow him for 1 more just to make sure that there is nothing that reopens on that the skin toughen's up before discharge from my perspective and wound care. Nonetheless I feel like he may benefit based on the type of pain he is having and the severity of the pain from something such as Lyrica or gabapentin. T that end I did speak with the case manager and subsequently I am going to see about going ahead and make o an referral to Dr. Horald Chestnut who is a local physiatrist to see if he can help out in this regard potentially. I do believe Cymbalta could even be of benefit for him. Objective Constitutional Well-nourished and well-hydrated in no acute distress. Vitals Time Taken: 9:48 AM, Height: 68 in, Source: Stated, Weight: 142 lbs, Source: Stated, BMI: 21.6, Temperature: 98.1 F, Pulse: 60 bpm, Respiratory Rate: 18 breaths/min, Blood Pressure: 147/68 mmHg. Respiratory normal breathing without difficulty. Psychiatric this patient is able to make decisions and demonstrates good insight into disease process. Alert and Oriented x 3. pleasant and cooperative. General  Notes: Upon inspection patient's wound again showed signs of about 90% epithelial growth and this is significantly improved with just 1 week's time. I am actually very pleased. Unfortunately his pain level has not diminished to an equal degree which is a little bit more unfortunate. Has not been able to drive his motorcycle which is his only mode of transportation at this point due to the severity of the pain he tells me even standing long periods of time is extremely excruciating. Nonetheless again this is outside of the realm of what I can manage and handle and wound care I think he could benefit from potentially Lyrica which would be a good option possibly even Cymbalta if necessary but honestly I think gabapentin or Lyrica would be the initial steps to take in this case. For that reason I am to see about making referral today. Integumentary (Hair, Skin) Wound #1 status is Open. Original cause of wound was Thermal Burn. The date acquired was: 06/22/2021. The wound has been in treatment 1 weeks. The wound is located on the Left,Dorsal Ankle. The wound measures 3cm length x 3.1cm width x 0.1cm depth; 7.304cm^2 area and 0.73cm^3 volume. There is Fat Layer (Subcutaneous Tissue) exposed. There is a medium amount of serosanguineous drainage noted. The wound margin is distinct with the outline attached to the wound base. There is medium (34-66%) red, pink granulation within the wound bed. There is a medium (34-66%) amount of necrotic tissue within the wound bed including Adherent Slough. Assessment Active Problems ICD-10 Burn of third degree of left ankle, initial encounter Nicotine dependence, cigarettes, with other nicotine-induced disorders Procedures Wound #1 Pre-procedure diagnosis of Wound #1 is a 3rd degree Burn located on the Left,Dorsal Ankle . An Dressings and/or debridement of burns; small procedure was performed by Lenda Kelp, PA. Post procedure  Diagnosis Wound #1: Same as  Pre-Procedure Notes: silvadene and xeroform Plan Follow-up Appointments: Return Appointment in 1 week. Bathing/ Shower/ Hygiene: May shower and wash wound with soap and water. - use dial antibacterial soap Consults ordered were: Pain Clinic - Dr. Ethelene Hal at [facility] due to nerve pain secondary to 3rd degree burn left lower leg WOUND #1: - Ankle Wound Laterality: Dorsal, Left Topical: Silvadene Cream 1 x Per Day/30 Days Discharge Instructions: Apply thin layer to wound bed only Prim Dressing: Xeroform Occlusive Gauze Dressing, 4x4 in 1 x Per Day/30 Days ary Discharge Instructions: Apply to wound bed as instructed Secondary Dressing: Bordered Gauze, 4x4 in 1 x Per Day/30 Days Discharge Instructions: Apply over primary dressing as directed. 1. Would recommend currently that we go ahead and continue with the wound care measures as before. I do believe he is doing quite well and overall I think that he is making excellent progress. There does not appear to be any signs of active infection at this time systemically. We did perform imaging with the MolecuLight DX which showed that he did have some slight red/yellow fluorescence around the edges of the wound indicative of higher bacterial loads but nothing extremely significant here which is great news. Overall I am actually very pleased with the way the wound appears. I think that continue with the Xeroform is ideal. 2. With regard to the nerve pain that he is experiencing I do believe that he would be benefited from seeing neurology although there are no neurologist that we will see patients for Worker's Comp. anymore according to his case manager in the [place] area. This is quite unfortunate. For that reason I Ernie Hew see about making referral to Dr. Sheran Luz who is a local physiatrist to see if he can help out with the neuropathic type pain here. Again I think this is related to the thermal burn. I do believe that something along the  lines of Lyrica could be beneficial for the patient. Again right now I have had him on Celebrex to try to help out with the discomfort as a stronger anti-inflammatory but he tells me that is just not seeming to help as significantly as he would like. Again the pain he is experiencing is somewhat disproportionate to the overall status of the wound which is actually doing quite well on the surface. 3. I am getting keep him out of work for the time being for the next 2 weeks as well as continuing his transportation as well since his only mode of personal transportation is a motorcycle which is not feasible for him right now with the amount of pain that he is having is also not good for the wound as we will try to get this to heal. We will see patient back for reevaluation in 1 week here in the clinic. If anything worsens or changes patient will contact our office for additional recommendations. MolecuLight DX: 1st Scanned Wound The following wound was scanned with MolecuLight DX): Left dorsal ankle Fluorescence bacterial imaging was medically necessary today due to Initial Evaluation of the wound with MolecuLightDX to determine baseline (Indication): bacterial bioburden level MolecuLight Results Green Colors, Yellow Colors The indicated colors were noted in the following area(s). In the periphery of the wound As a result of todays scan, the following treatment plans were put in place. Continue use of Xeroform as an antimicrobial dressing MolecuLight Procedure The MolecularLight DX device was cleaned with a disinfectant wipe prior to use., The correct patient  profile was confirmed and correct wound was verified., Range finder sensor used to ensure appropriate distance selected The following was completed: between imaging unit and wound bed, Room lights were turned off and the ambient light sensor was checked., Blue circle appeared around the lightbulb., The fluorescence icon was selected. Screen was  tapped to enhance focus and the image was captured. Additional drapes were used to ensure adequate darknesso No Potential ICD-10 Codes ICD-10 49.9 Bacterial infection unspecified (Red Color) Additional Scanned Wounds Did you scan any additional Woundso No Electronic Signature(s) Signed: 07/13/2021 10:21:37 AM By: Lenda Kelp PA-C Entered By: Lenda Kelp on 07/13/2021 10:21:37 -------------------------------------------------------------------------------- SuperBill Details Patient Name: Date of Service: Grant Savage, [name] 07/13/2021 Medical Record Number: [id number] Patient Account Number: [id number] Date of Birth/Sex: Treating RN: 1966-01-27 (55 y.o. Grant Mitchell Primary Care Provider: Raliegh Ip Other Clinician: Referring Provider: Treating Provider/Extender: Skeet Simmer in Treatment: 1 Diagnosis Coding ICD-10 Codes Code Description T25.312A Burn of third degree of left ankle, initial encounter F17.218 Nicotine dependence, cigarettes, with other nicotine-induced disorders Facility Procedures CPT4 Code: [id number] Description: 16020 - BURN DRSG W/O ANESTH-SM ICD-10 Diagnosis Description T25.312A Burn of third degree of left ankle, initial encounter Modifier: Quantity: 1 CPT4 Code: 0598T Description: NONCNTACT RT FLORO WND 1ST STE [id number] ICD-10 Diagnosis Description T25.312A Burn of third degree of left ankle, initial encounter Modifier: Quantity: 1 Physician Procedures : CPT4 Code Description Modifier [id number] - WC PHYS LEVEL 4 - EST PT 25 ICD-10 Diagnosis Description T25.312A Burn of third degree of left ankle, initial encounter F17.218 Nicotine dependence, cigarettes, with other nicotine-induced disorders Quantity: 1 : 0598T NONCNTACT RT FLORO WND 1ST STE ICD-10 Diagnosis Description T25.312A Burn of third degree of left ankle, initial encounter Quantity: 1 Electronic Signature(s) Signed: 07/13/2021 10:30:40 AM By: Lenda Kelp  PA-C Previous Signature: 07/13/2021 10:22:33 AM Version By: Lenda Kelp PA-C Entered By: Lenda Kelp on 07/13/2021 10:30:40
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
## 2021-07-20 ENCOUNTER — Encounter (HOSPITAL_BASED_OUTPATIENT_CLINIC_OR_DEPARTMENT_OTHER): Payer: Worker's Compensation | Admitting: Physician Assistant

## 2021-07-20 ENCOUNTER — Other Ambulatory Visit: Payer: Self-pay

## 2021-07-20 DIAGNOSIS — T25312A Burn of third degree of left ankle, initial encounter: Secondary | ICD-10-CM | POA: Diagnosis not present

## 2021-07-20 NOTE — Progress Notes (Addendum)
Grant Mitchell, Grant Mitchell ([id number]) Visit Report for 07/20/2021 Chief Complaint Document Details Patient Name: Date of Service: Grant Mitchell, Grant Mitchell Grant LD B. 07/20/2021 8:45 A M Medical Record Number: [id number] Patient Account Number: [id number] Date of Birth/Sex: Treating RN: 1966-07-01 (55 y.o. [name] Primary Care Provider: Raliegh Ip Other Clinician: Referring Provider: Treating Provider/Extender: Skeet Simmer in Treatment: 2 Information Obtained from: Patient Chief Complaint 3rd degree burn left ankle Electronic Signature(s) Signed: 07/20/2021 9:19:41 AM By: Lenda Kelp PA-C Entered By: Lenda Kelp on 07/20/2021 09:19:41 -------------------------------------------------------------------------------- HPI Details Patient Name: Date of Service: Grant Savage, Grant Mitchell NA LD B. 07/20/2021 8:45 A M Medical Record Number: [id number] Patient Account Number: [id number] Date of Birth/Sex: Treating RN: [birth year] (55 y.o. [name] Primary Care Provider: Raliegh Ip Other Clinician: Referring Provider: Treating Provider/Extender: Skeet Simmer in Treatment: 2 History of Present Illness HPI Description: 07/06/2021 upon evaluation today patient presents for initial evaluation here in the clinic concerning a wound that actually occurred due to a significant injury which is work-related. 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 Subsequently this burned through his pants, shoe, sock, and then got to the skin and the incident resolved this happened he was also taken off his shoe. T be perfectly honest I think that he is lucky to still have his foot. Nonetheless I Grant Mitchell o believe that this is something that needs to be addressed as far as the wound is concerned he does have a significant burn and I think that this is going  require careful care in order to ensure that it heals appropriately without any additional issues or complications. He voiced understanding of all of the above. He has been taking Percocet for pain. I think something like Celebrex could be beneficial for him try to help with the inflammation and thereby pain as well. The patient does have a history of cigarette smoking although I did advise him he needs to stop and that from a healing perspective this is what is going to help him the most as far as anything he can Grant Mitchell for himself. 07/13/2021 upon evaluation today patient's wound is actually showing signs of excellent improvement which is great news. Again this still is a significant issue where I think he probably sustained a greater degree of nerve damage due to the thermal burn during the initial injury. He still having significant pain which is actually disproportionate to the overall wound opening but again with a thermal burn as such I definitely can see this being an issue. Nonetheless I think as of today compared to last week he has about 90% healed. I fully expect him to probably be healed in the next 2 weeks possibly even by next week. Nonetheless even if he is closed next week I probably follow him for 1 more just to make sure that there is nothing that reopens on that the skin toughen's up before discharge from my perspective and wound care. Nonetheless I feel like he may benefit based on the type of pain he is having and the severity of the pain from something such as Lyrica or gabapentin. T that end I did speak with the case manager and subsequently I am going to see about going ahead and make o an referral to Dr. Horald Chestnut who is a local physiatrist to see if he can  help out in this regard potentially. I Grant Mitchell believe Cymbalta could even be of benefit for him. 07/20/2021 upon evaluation today patient appears to be doing well with regard to his wound on the dorsal surface of his foot/ankle region.  Fortunately there does not appear to be any evidence of infection currently which is great and overall I am extremely pleased with where we stand at this time. No fevers, chills, nausea, vomiting, or diarrhea. He does have his appointment set up for pain management that is in process that he should be getting an actual appointment date today. Electronic Signature(s) Signed: 07/20/2021 10:45:04 AM By: Lenda Kelp PA-C Entered By: Lenda Kelp on 07/20/2021 10:45:04 -------------------------------------------------------------------------------- Dressings and/or debridement of burns; small Details Patient Name: Date of Service: FENTER, Grant Mitchell Grant LD B. 07/20/2021 8:45 A M Medical Record Number: [id number] Patient Account Number: [id number] Date of Birth/Sex: Treating RN: 11-Jan-1966 (55 y.o. [name] Primary Care Provider: Raliegh Ip Other Clinician: Referring Provider: Treating Provider/Extender: Skeet Simmer in Treatment: 2 Procedure Performed for: Wound #1 Left,Dorsal Ankle Performed By: Physician Lenda Kelp, PA Post Procedure Diagnosis Same as Pre-procedure Electronic Signature(s) Signed: 07/20/2021 5:43:52 PM By: Zenaida Deed RN, BSN Signed: 07/21/2021 2:21:00 PM By: Lenda Kelp PA-C Entered By: Zenaida Deed on 07/20/2021 09:56:31 -------------------------------------------------------------------------------- Physical Exam Details Patient Name: Date of Service: Grant Savage, Grant Mitchell NA LD B. 07/20/2021 8:45 A M Medical Record Number: [id number] Patient Account Number: [id number] Date of Birth/Sex: Treating RN: 04-16-1966 (54 y.o. [name] Primary Care Provider: Raliegh Ip Other Clinician: Referring Provider: Treating Provider/Extender: Skeet Simmer in Treatment: 2 Constitutional Well-nourished and well-hydrated in no acute distress. Respiratory normal breathing without difficulty. Psychiatric this patient is able to make  decisions and demonstrates good insight into disease process. Alert and Oriented x 3. pleasant and cooperative. Notes Patient's wound bed actually showed signs of good epithelization and I am actually very pleased with where things stand today. I Grant Mitchell not see any signs of infection systemically which is great news and overall he seems to be doing quite well. No fevers, chills, nausea, vomiting, or diarrhea. I think were very close to complete resolution though not completely there yet. I did speak with his case manager and she notes that he should have an appointment with pain management, Dr. Horald Chestnut, by the end of the afternoon though know when that is. Electronic Signature(s) Signed: 07/20/2021 1:08:43 PM By: Lenda Kelp PA-C Entered By: Lenda Kelp on 07/20/2021 13:08:43 -------------------------------------------------------------------------------- Physician Orders Details Patient Name: Date of Service: Grant Savage, Grant Mitchell NA LD B. 07/20/2021 8:45 A M Medical Record Number: [id number] Patient Account Number: [id number] Date of Birth/Sex: Treating RN: January 16, 1966 (55 y.o. [name] Primary Care Provider: Raliegh Ip Other Clinician: Referring Provider: Treating Provider/Extender: Skeet Simmer in Treatment: 2 Verbal / Phone Orders: No Diagnosis Coding ICD-10 Coding Code Description T25.312A Burn of third degree of left ankle, initial encounter F17.218 Nicotine dependence, cigarettes, with other nicotine-induced disorders Follow-up Appointments Return Appointment in 1 week. Bathing/ Shower/ Hygiene May shower and wash wound with soap and water. - use dial antibacterial soap Wound Treatment Wound #1 - Ankle Wound Laterality: Dorsal, Left Prim Dressing: Xeroform Occlusive Gauze Dressing, 4x4 in 1 x Per Day/30 Days ary Discharge Instructions: Apply to wound bed as instructed Secondary Dressing: Bordered Gauze, 4x4 in 1 x Per Day/30 Days Discharge Instructions: Apply  over primary dressing as directed. Electronic Signature(s) Signed: 07/20/2021 5:43:52 PM By: Zenaida Deed  RN, BSN Signed: 07/21/2021 2:21:00 PM By: Lenda Kelp PA-C Entered By: Zenaida Deed on 07/20/2021 09:57:31 -------------------------------------------------------------------------------- Problem List Details Patient Name: Date of Service: Grant Savage, Grant Mitchell NA LD B. 07/20/2021 8:45 A M Medical Record Number: [id number] Patient Account Number: [id number] Date of Birth/Sex: Treating RN: January 30, 1966 (55 y.o. [name] Primary Care Provider: Raliegh Ip Other Clinician: Referring Provider: Treating Provider/Extender: Skeet Simmer in Treatment: 2 Active Problems ICD-10 Encounter Code Description Active Date MDM Diagnosis T25.312A Burn of third degree of left ankle, initial encounter 07/06/2021 No Yes F17.218 Nicotine dependence, cigarettes, with other nicotine-induced disorders 07/06/2021 No Yes Inactive Problems Resolved Problems Electronic Signature(s) Signed: 07/20/2021 9:19:35 AM By: Lenda Kelp PA-C Entered By: Lenda Kelp on 07/20/2021 09:19:35 -------------------------------------------------------------------------------- Progress Note Details Patient Name: Date of Service: Grant Savage, Grant Mitchell NA LD B. 07/20/2021 8:45 A M Medical Record Number: [id number] Patient Account Number: [id number] Date of Birth/Sex: Treating RN: 09-10-1966 (55 y.o. [name] Primary Care Provider: Raliegh Ip Other Clinician: Referring Provider: Treating Provider/Extender: Skeet Simmer in Treatment: 2 Subjective Chief Complaint Information obtained from Patient 3rd degree burn left ankle History of Present Illness (HPI) 07/06/2021 upon evaluation today patient presents for initial evaluation here in the clinic concerning a wound that actually occurred due to a significant injury which is work-related. He was actually working with molten metal when  unfortunately due to a series of unfortunate events he inadvertently poured some of this on the dorsal surface of his foot right of the ankle. Subsequently this burned through his pants, shoe, sock, and then got to the skin and the incident resolved this happened he was also taken off his shoe. T be perfectly honest I think that he is lucky to still have his foot. Nonetheless I Grant Mitchell believe that this o is something that needs to be addressed as far as the wound is concerned he does have a significant burn and I think that this is going require careful care in order to ensure that it heals appropriately without any additional issues or complications. He voiced understanding of all of the above. He has been taking Percocet for pain. I think something like Celebrex could be beneficial for him try to help with the inflammation and thereby pain as well. The patient does have a history of cigarette smoking although I did advise him he needs to stop and that from a healing perspective this is what is going to help him the most as far as anything he can Grant Mitchell for himself. 07/13/2021 upon evaluation today patient's wound is actually showing signs of excellent improvement which is great news. Again this still is a significant issue where I think he probably sustained a greater degree of nerve damage due to the thermal burn during the initial injury. He still having significant pain which is actually disproportionate to the overall wound opening but again with a thermal burn as such I definitely can see this being an issue. Nonetheless I think as of today compared to last week he has about 90% healed. I fully expect him to probably be healed in the next 2 weeks possibly even by next week. Nonetheless even if he is closed next week I probably follow him for 1 more just to make sure that there is nothing that reopens on that the skin toughen's up before discharge from my perspective and wound care. Nonetheless I feel  like he may benefit based on the type of pain he is having and  the severity of the pain from something such as Lyrica or gabapentin. T that end I did speak with the case manager and subsequently I am going to see about going ahead and make o an referral to Dr. Horald Chestnut who is a local physiatrist to see if he can help out in this regard potentially. I Grant Mitchell believe Cymbalta could even be of benefit for him. 07/20/2021 upon evaluation today patient appears to be doing well with regard to his wound on the dorsal surface of his foot/ankle region. Fortunately there does not appear to be any evidence of infection currently which is great and overall I am extremely pleased with where we stand at this time. No fevers, chills, nausea, vomiting, or diarrhea. He does have his appointment set up for pain management that is in process that he should be getting an actual appointment date today. Objective Constitutional Well-nourished and well-hydrated in no acute distress. Vitals Time Taken: 8:54 AM, Height: 68 in, Weight: 142 lbs, BMI: 21.6, Temperature: 98.1 F, Pulse: 65 bpm, Respiratory Rate: 16 breaths/min, Blood Pressure: 151/61 mmHg. Respiratory normal breathing without difficulty. Psychiatric this patient is able to make decisions and demonstrates good insight into disease process. Alert and Oriented x 3. pleasant and cooperative. General Notes: Patient's wound bed actually showed signs of good epithelization and I am actually very pleased with where things stand today. I Grant Mitchell not see any signs of infection systemically which is great news and overall he seems to be doing quite well. No fevers, chills, nausea, vomiting, or diarrhea. I think were very close to complete resolution though not completely there yet. I did speak with his case manager and she notes that he should have an appointment with pain management, Dr. Horald Chestnut, by the end of the afternoon though know when that is. Integumentary (Hair,  Skin) Wound #1 status is Open. Original cause of wound was Thermal Burn. The date acquired was: 06/22/2021. The wound has been in treatment 2 weeks. The wound is located on the Left,Dorsal Ankle. The wound measures 3.2cm length x 3.5cm width x 0.1cm depth; 8.796cm^2 area and 0.88cm^3 volume. There is a medium amount of serosanguineous drainage noted. Assessment Active Problems ICD-10 Burn of third degree of left ankle, initial encounter Nicotine dependence, cigarettes, with other nicotine-induced disorders Procedures Wound #1 Pre-procedure diagnosis of Wound #1 is a 3rd degree Burn located on the Left,Dorsal Ankle . An Dressings and/or debridement of burns; small procedure was performed by Lenda Kelp, PA. Post procedure Diagnosis Wound #1: Same as Pre-Procedure Plan Follow-up Appointments: Return Appointment in 1 week. Bathing/ Shower/ Hygiene: May shower and wash wound with soap and water. - use dial antibacterial soap WOUND #1: - Ankle Wound Laterality: Dorsal, Left Prim Dressing: Xeroform Occlusive Gauze Dressing, 4x4 in 1 x Per Day/30 Days ary Discharge Instructions: Apply to wound bed as instructed Secondary Dressing: Bordered Gauze, 4x4 in 1 x Per Day/30 Days Discharge Instructions: Apply over primary dressing as directed. 1. Would recommend currently that we going continue with the wound care measures as before with regard to the Xeroform I think that still an appropriate way to go. 2. I am also going to recommend that we have the patient going continue with the border gauze dressing to cover which I think is also doing an excellent job. We will see patient back for reevaluation in 1 week here in the clinic. If anything worsens or changes patient will contact our office for additional recommendations. Electronic Signature(s) Signed: 07/20/2021 1:09:17 PM By:  Linwood Dibbles, Mahlia Fernando PA-C Entered By: Lenda Kelp on 07/20/2021  13:09:16 -------------------------------------------------------------------------------- SuperBill Details Patient Name: Date of Service: Grant Mitchell, Grant Mitchell Grant LD B. 07/20/2021 Medical Record Number: [id number] Patient Account Number: [id number] Date of Birth/Sex: Treating RN: 03-01-1966 (56 y.o. [name] Primary Care Provider: Raliegh Ip Other Clinician: Referring Provider: Treating Provider/Extender: Skeet Simmer in Treatment: 2 Diagnosis Coding ICD-10 Codes Code Description T25.312A Burn of third degree of left ankle, initial encounter F17.218 Nicotine dependence, cigarettes, with other nicotine-induced disorders Facility Procedures CPT4 Code: [id number] Description: 16020 - BURN DRSG W/O ANESTH-SM ICD-10 Diagnosis Description T25.312A Burn of third degree of left ankle, initial encounter Modifier: Quantity: 1 Physician Procedures : CPT4 Code Description Modifier [id number] - WC PHYS LEVEL 3 - EST PT ICD-10 Diagnosis Description T25.312A Burn of third degree of left ankle, initial encounter F17.218 Nicotine dependence, cigarettes, with other nicotine-induced disorders Quantity: 1 Electronic Signature(s) Signed: 07/20/2021 1:09:42 PM By: Lenda Kelp PA-C Entered By: Lenda Kelp on 07/20/2021 13:09:42
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
## 2021-07-27 ENCOUNTER — Encounter (HOSPITAL_BASED_OUTPATIENT_CLINIC_OR_DEPARTMENT_OTHER): Payer: Worker's Compensation | Admitting: Physician Assistant

## 2021-07-27 ENCOUNTER — Other Ambulatory Visit: Payer: Self-pay

## 2021-07-27 DIAGNOSIS — T25312A Burn of third degree of left ankle, initial encounter: Secondary | ICD-10-CM | POA: Diagnosis not present
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
